# Patient Record
Sex: Female | Born: 1975 | Race: Black or African American | Hispanic: No | Smoking: Never smoker
Health system: Southern US, Community
[De-identification: ages and names within clinical notes are randomized; demographics above are authoritative.]

## PROBLEM LIST (undated history)

## (undated) DIAGNOSIS — D219 Benign neoplasm of connective and other soft tissue, unspecified: Secondary | ICD-10-CM

## (undated) DIAGNOSIS — IMO0002 Reserved for concepts with insufficient information to code with codable children: Secondary | ICD-10-CM

## (undated) DIAGNOSIS — R87619 Unspecified abnormal cytological findings in specimens from cervix uteri: Secondary | ICD-10-CM

## (undated) DIAGNOSIS — B009 Herpesviral infection, unspecified: Secondary | ICD-10-CM

## (undated) DIAGNOSIS — G43009 Migraine without aura, not intractable, without status migrainosus: Secondary | ICD-10-CM

## (undated) HISTORY — DX: Benign neoplasm of connective and other soft tissue, unspecified: D21.9

## (undated) HISTORY — PX: COLPOSCOPY: SHX161

## (undated) HISTORY — DX: Reserved for concepts with insufficient information to code with codable children: IMO0002

## (undated) HISTORY — DX: Unspecified abnormal cytological findings in specimens from cervix uteri: R87.619

## (undated) HISTORY — DX: Herpesviral infection, unspecified: B00.9

## (undated) HISTORY — DX: Migraine without aura, not intractable, without status migrainosus: G43.009

---

## 2004-11-14 ENCOUNTER — Other Ambulatory Visit: Admission: RE | Admit: 2004-11-14 | Discharge: 2004-11-14 | Payer: Self-pay | Admitting: Obstetrics and Gynecology

## 2004-12-02 HISTORY — PX: TUBAL LIGATION: SHX77

## 2005-07-18 ENCOUNTER — Inpatient Hospital Stay (HOSPITAL_COMMUNITY): Admission: AD | Admit: 2005-07-18 | Discharge: 2005-07-18 | Payer: Self-pay | Admitting: Obstetrics and Gynecology

## 2005-07-20 ENCOUNTER — Inpatient Hospital Stay (HOSPITAL_COMMUNITY): Admission: AD | Admit: 2005-07-20 | Discharge: 2005-07-22 | Payer: Self-pay | Admitting: Obstetrics and Gynecology

## 2006-07-31 ENCOUNTER — Other Ambulatory Visit: Admission: RE | Admit: 2006-07-31 | Discharge: 2006-07-31 | Payer: Self-pay | Admitting: Obstetrics and Gynecology

## 2007-06-02 ENCOUNTER — Other Ambulatory Visit: Admission: RE | Admit: 2007-06-02 | Discharge: 2007-06-02 | Payer: Self-pay | Admitting: Obstetrics and Gynecology

## 2007-06-24 ENCOUNTER — Ambulatory Visit (HOSPITAL_COMMUNITY): Admission: RE | Admit: 2007-06-24 | Discharge: 2007-06-24 | Payer: Self-pay | Admitting: Obstetrics and Gynecology

## 2007-12-03 HISTORY — PX: BREAST SURGERY: SHX581

## 2007-12-14 ENCOUNTER — Other Ambulatory Visit: Admission: RE | Admit: 2007-12-14 | Discharge: 2007-12-14 | Payer: Self-pay | Admitting: Obstetrics and Gynecology

## 2008-01-07 ENCOUNTER — Encounter: Admission: RE | Admit: 2008-01-07 | Discharge: 2008-02-04 | Payer: Self-pay | Admitting: Plastic Surgery

## 2008-03-24 ENCOUNTER — Ambulatory Visit (HOSPITAL_BASED_OUTPATIENT_CLINIC_OR_DEPARTMENT_OTHER): Admission: RE | Admit: 2008-03-24 | Discharge: 2008-03-25 | Payer: Self-pay | Admitting: Plastic Surgery

## 2008-03-24 ENCOUNTER — Encounter (INDEPENDENT_AMBULATORY_CARE_PROVIDER_SITE_OTHER): Payer: Self-pay | Admitting: Plastic Surgery

## 2011-04-16 NOTE — Op Note (Signed)
NAME:  Chelsea Short, FANTON  ACCOUNT NO.:  [id number]   MEDICAL RECORD NO.:  [id number]          PATIENT TYPE:  AMB   LOCATION:  SDC                           FACILITY:  [facility]   PHYSICIAN:  Charles A. Delcambre, MDDATE OF BIRTH:  08-12-1976   DATE OF PROCEDURE:  06/24/2007  DATE OF DISCHARGE:                               OPERATIVE REPORT   PREOPERATIVE DIAGNOSIS:  Undesired fertility.   POSTOPERATIVE DIAGNOSES:  1. Undesired fertility.  2. Mild endometriosis.   PROCEDURE:  Laparoscopic bilateral tubal ligation with Falope ring.   SURGEON:  Charles A. Delcambre, MD.   ASSISTANT:  None.   COMPLICATIONS:  None.   ESTIMATED BLOOD LOSS:  Less than 5 ml.   SPECIMENS:  None.   ANESTHESIA:  General by the endotracheal route.   FINDINGS:  Normal upper abdomen grossly, could not see the appendix, the  left uterosacral and ovarian fossa Masters-Johnson window consistent  with mild endometriosis.  No active disease noted other than this.   DESCRIPTION OF PROCEDURE:  Patient was taken to the operating room and  placed in supine position.  A general anesthetic was induced without  difficulty.  She was then placed in dorsal lithotomy position in  universal stirrups, and sterile prep and drape was undertaken.  A  tenaculum was used on the cervix, a Cohen cannula was placed.  Attention  was given to the abdomen, 0.25% plain Marcaine was injected, a total of  10 ml divided approximately 7 ml at the umbilicus and 3 ml at the  suprapubic port site.  A 1-cm incision was made at the umbilicus, a  Veress needle was placed with anterior traction on the abdominal wall.  Aspiration, hanging injection, reaspiration, hanging drop test, and  insufflation pressure was 4 mmHg.  All indicated anteperitoneal  location.  Flow at that time was 2 liters per minute and this was  increased to high flow once the intraperitoneal location was verified  and she had adequate pneumoperitoneum at 2.5 liters  CO2 insufflated.  The Veress needle was removed and true traction on the abdominal wall  was again placed, a 10-mm port was placed without damage to bowel,  bladder, or vascular structures.  The anteperitoneal location was  verified by immediate placement of the scope.  Under direct  visualization, the Falope ring applicator was made through approximately  an 8-mm incision two fingerbreadths below the symphysis pubis at the  midline.  There was no damage to bowel, bladder, or vascular structures.  The Falope ring applicator was then used to place Falope rings easily  seeing the mid isthmic portion of the tube approximately 2.5 cm from the  cornua.  An adequate knuckle of tube was pulled up on either side with  one Falope ring applied on each side.  Low pressure visualization of the  trocar sites was visualized and was hemostatic.  Desufflation was  allowed to occur, trocars were removed, the peritoneal edge was  visualized upon removal of the umbilical port.  A #0 Vicryl was used to  plicate the fascia at the umbilicus carefully, then a #3-0 Monocryl was  used to close the umbilical incision with a  subcuticular stitch.  Hemostasis was good at the lower incision site.  Pressure was held to  ensure hemostasis was good, and then Dermabond was placed on the  incision to seal it.  The tenaculum was removed from the cervix, silver  nitrate was placed, minor bleeding was noted, pressure was held, silver  nitrate was used again, and this achieved excellent hemostasis.  All  instruments were removed, the patient was awakened and taken to recovery  with the physician in attendance.      Charles A. Sydnee Cabal, MD  Electronically Signed     CAD/MEDQ  D:  06/24/2007  T:  06/24/2007  Job:  [id number]

## 2011-04-16 NOTE — H&P (Signed)
NAME:  Chelsea Short, ESCHETE  ACCOUNT NO.:  [id number]   MEDICAL RECORD NO.:  [id number]          PATIENT TYPE:  AMB   LOCATION:  SDC                           [facility]ACILITY:  [facility]   PHYSICIAN:  Charles A. Delcambre, MDDATE OF BIRTH:  [birth year]   DATE OF ADMISSION:  06/18/2007  DATE OF DISCHARGE:                              HISTORY & PHYSICAL   DATE OF ADMISSION:  This patient will be admitted on June 18, 2007 to  undergo laparoscopic tubal ligation.   INFORMED CONSENT:  She gives informed consent, accepts risks of injury  to bowel or bladder, vascular structures, ureteral damage, blood product  risks including hepatitis and HIV exposure, approximately 1 in 400  failure rate.  Alternative forms of contraception including vasectomy  have been discussed.  She gives informed consent in this regard.   HISTORY OF PRESENT ILLNESS:  The patient is a 35 year old gravida 3,  para 1-0-2-0, last menstrual period was May 14, 2007.  All questions  were answered.  She gives informed consent.   PAST MEDICAL HISTORY:  None.   PAST SURGICAL HISTORY:  None other than vaginal delivery and D and C's  for miscarriages.   MEDICATIONS:  Currently none.   ALLERGIES:  No known drug allergies.   FAMILY HISTORY:  No major illnesses.   REVIEW OF SYSTEMS:  No chest pain, shortness of breath, wheezing,  headache, vision changes, abdominal changes, bowel or bladder changes.   PHYSICAL EXAMINATION:  GENERAL APPEARANCE:  Alert and oriented x3.  VITAL SIGNS:  Blood pressure 124/80.  Respirations 16.  Heart rate 80.  Weight 180 pounds.  HEENT:  Exam is grossly within normal limits.  NECK:  Supple without thyromegaly or adenopathy.  LUNGS:  Clear bilaterally.  BACK:  No costovertebral angle tenderness, vertebral column is nontender  to palpation.  HEART:  Regular rate and rhythm without murmurs, rubs or gallops.  BREASTS:  No masses, tenderness, discharge, skin or nipple changes  bilaterally.  ABDOMEN:   Soft, flat, nontender.  No hepatosplenomegaly noted.  PELVIC:  Normal external female genitalia.  Bartholin's, urethral and  Skene's within normal limits.  Vault is without discharge or lesions.  Multiparous cervix.  Uterus anteverted, six weeks size.  Adnexa  nontender without masses bilaterally.  Ovaries palpable and normal size  bilaterally.  RECTAL:  Exam is not done.  Anus and perineal body appear normal.   ASSESSMENT:  Request for sterilization by bilateral tubal ligation.  Informed consent is given as noted above.   PLAN:  Surgery is scheduled for June 18, 2007.  Plan preoperative CBC,  quantitative HCG and we will proceed as outlined.  She will remain NPO  past 0600 with surgery scheduled in the afternoon.      Charles A. Sydnee Cabal, MD  Electronically Signed     CAD/MEDQ  D:  06/04/2007  T:  06/05/2007  Job:  [id number]

## 2011-04-16 NOTE — Op Note (Signed)
NAME:  Chelsea Short, Chelsea Short              ACCOUNT NO.:  [id number]   MEDICAL RECORD NO.:  [id number]          PATIENT TYPE:  AMB   LOCATION:  DSC                          FACILITY:  [facility]   PHYSICIAN:  Brantley Persons, M.D.DATE OF BIRTH:  [birth year]   DATE OF PROCEDURE:  03/24/2008  DATE OF DISCHARGE:                               OPERATIVE REPORT   POSTOPERATIVE DIAGNOSIS:  Bilateral macromastia.   POSTOPERATIVE DIAGNOSIS:  Bilateral macromastia.   PROCEDURE:  Bilateral reduction mammoplasties.   ATTENDING SURGEON:  Brantley Persons, MD   ANESTHESIA:  General endotracheal.   ANESTHESIOLOGIST:  Germaine Pomfret, MD   ESTIMATED BLOOD LOSS:  200 mL.   FLUID REPLACEMENT:  3200 mL crystalloid.   URINE OUTPUT:  200 mL.   COMPLICATIONS:  None.   JUSTIFICATION FOR OVERNIGHT STAY:  Progressive pain control along with  ambulation and monitoring of the nipples and breast flaps.   INDICATIONS FOR PROCEDURE:  The patient is a 35 year old Hispanic female  who has bilateral macromastia that is clinically symptomatic.  She  presents to undergo bilateral reduction mammoplasties.   PROCEDURE:  The patient was marked in the preop holding area in the  pattern of WISE for the future bilateral reduction mammoplasties.  She  was then taken back to the OR and placed on the table in supine  position.  After adequate general endotracheal anesthesia was obtained,  the patient's chest was prepped with [employer]icare and draped in the  sterile fashion.  The base of the breast had been injected with 1%  lidocaine with epinephrine.  After adequate hemostasis anesthesia taken  effect, the procedure was begun.   Both of the breast reductions were performed in the following similar  manner.  The nipple-areolar complexes were marked with the 45 mm nipple  marker.  This was then incised and the skin was de-epithelialized down  to the inframammary crease in the inferior pedicle pattern.  Next, the  medial,  superior, and lateral skin flaps were elevated down to the chest  wall.  The excess fat and glandular tissue were removed from the  inferior pedicle.  The nipple-areolar complex was examined and found be  pink and viable.  The wound was then irrigated with saline irrigation.  Meticulous hemostasis was obtained with Bovie electrocautery.  The  inferior pedicle was then centralized using 3-0 Prolene suture.  A #10  JP flat fully-fluted drain was then placed into the wound.  The skin  flaps were brought together at the inverted T-junction with a 2-0  Prolene suture.  The incisions were stapled for temporary closure.  The  breasts were compared and found to have good shape and symmetry.  The  incisions were then closed from the medial aspect of the JP drain to the  medial aspect of the WISE pattern, first placing a few sutures in the  dermal layer with 3-0 Monocryl suture and then both the dermal and  cuticular layer were closed using a Quill 2-0 PDO barbed suture.  Lateral to the JP drain, incision was closed using 3-0 Monocryl in the  dermal layer followed by 3-0  Monocryl running intracuticular stitch on  the skin.  The vertical limb of the WISE pattern was closed in the  dermal layer using 3-0 Monocryl suture.   The patient was then placed in the upright position.  The future  location of the nipple-areolar complexes was marked on both breast  mounds using the 42 mm nipple marker.  She was then placed back in the  recumbent position.   Both of the nipple-areolar complexes were then brought out onto the  breast mound in the following similar manner.  The skin was incised as  marked.  The tissue was excised in full thickness.  The nipple-areolar  complex was examined and found to be pink and viable, then brought out  through this aperture and sewn in place using 4-0 Monocryl in the dermal  layer followed by 5-0 Monocryl running intracuticular stitch on the  skin.  This 5-0 Monocryl suture  was then brought down to close the  cuticular layer of the vertical limb as well.  The JP drain was sewn in  place using 3-0 nylon suture.  The incisions were dressed with Benzoin  and Steri-Strips and the nipples additionally with bacitracin ointment  and Adaptic.  There were no complications.  The patient tolerated the  procedure well.  The base of the breasts and the skin and soft tissues  were then injected with 0.5% Marcaine with epinephrine to provide a  postsurgical anesthetic block.  4 x 4s were placed over the incisions  and ABD pads over the axillary areas.  The patient was then placed into  a light postoperative support bra.   There were no complications.  The patient tolerated the procedure well.  The final needle and sponge counts were reported to be correct at the  end of the case.  The patient was then awakened from the general  anesthesia and taken to the recovery room in stable condition.  She was  recovered without complications and then taken to the [facility] where she will  stay overnight.  She will stay overnight for monitoring of the nipples  and breast flaps as well as progressive pain control and ambulation.[name]  Discharge is planned for the morning.           ______________________________  Brantley Persons, M.D.     MC/MEDQ  D:  03/24/2008  T:  03/25/2008  Job:  [id number]

## 2011-09-16 LAB — CBC
HCT: 39.9
MCHC: 33.7
MCV: 90.5
RBC: 4.41
WBC: 5.9

## 2011-09-16 LAB — HCG, SERUM, QUALITATIVE: Preg, Serum: NEGATIVE

## 2013-05-10 ENCOUNTER — Encounter: Payer: Self-pay | Admitting: *Deleted

## 2013-05-11 ENCOUNTER — Ambulatory Visit (INDEPENDENT_AMBULATORY_CARE_PROVIDER_SITE_OTHER): Payer: BC Managed Care – PPO | Admitting: Nurse Practitioner

## 2013-05-11 ENCOUNTER — Encounter: Payer: Self-pay | Admitting: Nurse Practitioner

## 2013-05-11 VITALS — BP 126/74 | HR 68 | Resp 14 | Ht 65.5 in | Wt 213.0 lb

## 2013-05-11 DIAGNOSIS — B9689 Other specified bacterial agents as the cause of diseases classified elsewhere: Secondary | ICD-10-CM

## 2013-05-11 DIAGNOSIS — N760 Acute vaginitis: Secondary | ICD-10-CM

## 2013-05-11 DIAGNOSIS — A499 Bacterial infection, unspecified: Secondary | ICD-10-CM

## 2013-05-11 MED ORDER — METRONIDAZOLE 500 MG PO TABS: 500.0000 mg | ORAL_TABLET | ORAL | Status: DC

## 2013-05-11 NOTE — Progress Notes (Signed)
Subjective:     Patient ID: Chelsea Short, female   DOB: Sep 29, 1976, 37 y.o.   MRN: [id number]  Vaginal Discharge The patient's primary symptoms include a vaginal discharge. Primary symptoms comment: Vaginal discharge AND odor for past 2-3 weeks. This is a recurrent problem. The current episode started 1 to 4 weeks ago. The problem occurs constantly. The problem has been unchanged. The patient is experiencing no pain. Associated symptoms include painful intercourse. Pertinent negatives include no abdominal pain, anorexia, back pain, chills, constipation, diarrhea, fever, frequency, hematuria, nausea, urgency or vomiting. The vaginal discharge was white, malodorous and milky. She has not been passing clots. Treatments tried: Baking soda and h2o. She is sexually active. No, her partner does not have an STD. She uses tubal ligation (same partner for 14 months) for contraception. Her menstrual history has been regular. Her past medical history is significant for vaginosis.     Review of Systems  Constitutional: Negative for fever and chills.  Gastrointestinal: Negative for nausea, vomiting, abdominal pain, diarrhea, constipation and anorexia.  Genitourinary: Positive for vaginal discharge. Negative for urgency, frequency and hematuria.       LMP 04/28/13 and normal. Has no Urinary symptoms.  Musculoskeletal: Negative for back pain.  Neurological: Negative.   Hematological: Negative.   Psychiatric/Behavioral: Negative.        Objective:   Physical Exam  Constitutional: She is oriented to person, place, and time. She appears well-developed and well-nourished.  Pulmonary/Chest: Effort normal.  Abdominal: Soft. She exhibits no distension and no mass. There is no tenderness. There is no rebound and no guarding.  Genitourinary:  Thin white to grey vagina discharge.  Ph 5.5, Saline is + for clue cells, no trich,  KOH negative for yeast. No pain on bimanual exam.  Musculoskeletal: Normal range of  motion.  Neurological: She is alert and oriented to person, place, and time.  Psychiatric: She has a normal mood and affect. Her behavior is normal. Thought content normal.       Assessment:    BV with history of same No concerns about STD's    Plan:     Patient's preference is po Flagyl at 1 Gm X 2 doses instead of 7 day therapy.  States she does not get GI side effects and does not consume ETOH. If symptoms are not resolved or worsens to call back.

## 2013-05-11 NOTE — Patient Instructions (Signed)
Bacterial Vaginosis Bacterial vaginosis (BV) is a vaginal infection where the normal balance of bacteria in the vagina is disrupted. The normal balance is then replaced by an overgrowth of certain bacteria. There are several different kinds of bacteria that can cause BV. BV is the most common vaginal infection in women of childbearing age. CAUSES   The cause of BV is not fully understood. BV develops when there is an increase or imbalance of harmful bacteria.  Some activities or behaviors can upset the normal balance of bacteria in the vagina and put women at increased risk including:  Having a new sex partner or multiple sex partners.  Douching.  Using an intrauterine device (IUD) for contraception.  It is not clear what role sexual activity plays in the development of BV. However, women that have never had sexual intercourse are rarely infected with BV. Women do not get BV from toilet seats, bedding, swimming pools or from touching objects around them.  SYMPTOMS   Grey vaginal discharge.  A fish-like odor with discharge, especially after sexual intercourse.  Itching or burning of the vagina and vulva.  Burning or pain with urination.  Some women have no signs or symptoms at all. DIAGNOSIS  Your caregiver must examine the vagina for signs of BV. Your caregiver will perform lab tests and look at the sample of vaginal fluid through a microscope. They will look for bacteria and abnormal cells (clue cells), a pH test higher than 4.5, and a positive amine test all associated with BV.  RISKS AND COMPLICATIONS   Pelvic inflammatory disease (PID).  Infections following gynecology surgery.  Developing HIV.  Developing herpes virus. TREATMENT  Sometimes BV will clear up without treatment. However, all women with symptoms of BV should be treated to avoid complications, especially if gynecology surgery is planned. Female partners generally do not need to be treated. However, BV may spread  between female sex partners so treatment is helpful in preventing a recurrence of BV.   BV may be treated with antibiotics. The antibiotics come in either pill or vaginal cream forms. Either can be used with nonpregnant or pregnant women, but the recommended dosages differ. These antibiotics are not harmful to the baby.  BV can recur after treatment. If this happens, a second round of antibiotics will often be prescribed.  Treatment is important for pregnant women. If not treated, BV can cause a premature delivery, especially for a pregnant woman who had a premature birth in the past. All pregnant women who have symptoms of BV should be checked and treated.  For chronic reoccurrence of BV, treatment with a type of prescribed gel vaginally twice a week is helpful. HOME CARE INSTRUCTIONS   Finish all medication as directed by your caregiver.  Do not have sex until treatment is completed.  Tell your sexual partner that you have a vaginal infection. They should see their caregiver and be treated if they have problems, such as a mild rash or itching.  Practice safe sex. Use condoms. Only have 1 sex partner. PREVENTION  Basic prevention steps can help reduce the risk of upsetting the natural balance of bacteria in the vagina and developing BV:  Do not have sexual intercourse (be abstinent).  Do not douche.  Use all of the medicine prescribed for treatment of BV, even if the signs and symptoms go away.  Tell your sex partner if you have BV. That way, they can be treated, if needed, to prevent reoccurrence. SEEK MEDICAL CARE IF:     Your symptoms are not improving after 3 days of treatment.  You have increased discharge, pain, or fever. MAKE SURE YOU:   Understand these instructions.  Will watch your condition.  Will get help right away if you are not doing well or get worse. FOR MORE INFORMATION  Division of STD Prevention (DSTDP), Centers for Disease Control and Prevention:  www.cdc.gov/std American [employer] Association (ASHA): www.ashastd.org  Document Released: 11/18/2005 Document Revised: 02/10/2012 Document Reviewed: 05/11/2009 [employer] Patient Information 2014 [employer].  

## 2013-05-13 NOTE — Progress Notes (Signed)
Reviewed personally.  M. Suzanne Angelina Venard, MD.  

## 2013-08-30 ENCOUNTER — Emergency Department (HOSPITAL_COMMUNITY)
Admission: EM | Admit: 2013-08-30 | Discharge: 2013-08-30 | Disposition: A | Discharge status: Home / Self Care | Payer: BC Managed Care – PPO | Attending: Emergency Medicine | Admitting: Emergency Medicine

## 2013-08-30 ENCOUNTER — Emergency Department (HOSPITAL_COMMUNITY): Payer: BC Managed Care – PPO

## 2013-08-30 ENCOUNTER — Encounter (HOSPITAL_COMMUNITY): Payer: Self-pay | Admitting: *Deleted

## 2013-08-30 DIAGNOSIS — Z79899 Other long term (current) drug therapy: Secondary | ICD-10-CM | POA: Insufficient documentation

## 2013-08-30 DIAGNOSIS — H9319 Tinnitus, unspecified ear: Secondary | ICD-10-CM | POA: Insufficient documentation

## 2013-08-30 DIAGNOSIS — Z8619 Personal history of other infectious and parasitic diseases: Secondary | ICD-10-CM | POA: Insufficient documentation

## 2013-08-30 DIAGNOSIS — Z792 Long term (current) use of antibiotics: Secondary | ICD-10-CM | POA: Insufficient documentation

## 2013-08-30 DIAGNOSIS — M542 Cervicalgia: Secondary | ICD-10-CM | POA: Insufficient documentation

## 2013-08-30 DIAGNOSIS — R1310 Dysphagia, unspecified: Secondary | ICD-10-CM | POA: Insufficient documentation

## 2013-08-30 DIAGNOSIS — I889 Nonspecific lymphadenitis, unspecified: Secondary | ICD-10-CM | POA: Insufficient documentation

## 2013-08-30 DIAGNOSIS — R51 Headache: Secondary | ICD-10-CM | POA: Insufficient documentation

## 2013-08-30 DIAGNOSIS — R519 Headache, unspecified: Secondary | ICD-10-CM

## 2013-08-30 DIAGNOSIS — H53149 Visual discomfort, unspecified: Secondary | ICD-10-CM | POA: Insufficient documentation

## 2013-08-30 LAB — CBC WITH DIFFERENTIAL/PLATELET
Basophils Relative: 0 % (ref 0–1)
Eosinophils Absolute: 0 10*3/uL (ref 0.0–0.7)
Eosinophils Relative: 0 % (ref 0–5)
HCT: 37.3 % (ref 36.0–46.0)
Lymphocytes Relative: 23 % (ref 12–46)
Lymphs Abs: 2.3 10*3/uL (ref 0.7–4.0)
MCH: 29.9 pg (ref 26.0–34.0)
MCHC: 34.3 g/dL (ref 30.0–36.0)
MCV: 87.1 fL (ref 78.0–100.0)
Monocytes Absolute: 0.8 10*3/uL (ref 0.1–1.0)
Platelets: 218 10*3/uL (ref 150–400)
RBC: 4.28 MIL/uL (ref 3.87–5.11)
RDW: 14.2 % (ref 11.5–15.5)

## 2013-08-30 LAB — BASIC METABOLIC PANEL
CO2: 25 mEq/L (ref 19–32)
Calcium: 9 mg/dL (ref 8.4–10.5)
Creatinine, Ser: 0.91 mg/dL (ref 0.50–1.10)
GFR calc non Af Amer: 80 mL/min — ABNORMAL LOW (ref >90)
Glucose, Bld: 87 mg/dL (ref 70–99)
Sodium: 132 mEq/L — ABNORMAL LOW (ref 135–145)

## 2013-08-30 MED ORDER — SODIUM CHLORIDE 0.9 % IV BOLUS (SEPSIS)
1000.0000 mL | Freq: Once | INTRAVENOUS | Status: AC
  Administered 2013-08-30: 1000 mL via INTRAVENOUS

## 2013-08-30 MED ORDER — KETOROLAC TROMETHAMINE 30 MG/ML IJ SOLN
30.0000 mg | Freq: Once | INTRAMUSCULAR | Status: AC
  Administered 2013-08-30: 30 mg via INTRAVENOUS
  Filled 2013-08-30: qty 1

## 2013-08-30 MED ORDER — METOCLOPRAMIDE HCL 5 MG/ML IJ SOLN
10.0000 mg | Freq: Once | INTRAMUSCULAR | Status: AC
  Administered 2013-08-30: 10 mg via INTRAVENOUS
  Filled 2013-08-30: qty 2

## 2013-08-30 MED ORDER — HYDROCODONE-ACETAMINOPHEN 5-325 MG PO TABS: 1.0000 | ORAL_TABLET | Freq: Four times a day (QID) | ORAL | Status: DC | PRN

## 2013-08-30 MED ORDER — CLINDAMYCIN HCL 150 MG PO CAPS: 300.0000 mg | ORAL_CAPSULE | Freq: Three times a day (TID) | ORAL | Status: DC

## 2013-08-30 MED ORDER — IOHEXOL 300 MG/ML  SOLN
75.0000 mL | Freq: Once | INTRAMUSCULAR | Status: AC | PRN
  Administered 2013-08-30: 75 mL via INTRAVENOUS

## 2013-08-30 NOTE — ED Notes (Signed)
Patient states migraine since last Thursday, patient states pain in back of head down into neck and shoulders

## 2013-08-30 NOTE — ED Provider Notes (Signed)
CSN: [id number]     Arrival date & time 08/30/13  0804 History   First MD Initiated Contact with Patient 08/30/13 [phone]     Chief Complaint  Patient presents with  . Migraine   (Consider location/radiation/quality/duration/timing/severity/associated sxs/prior Treatment) HPI Comments: Patient is a 37 year old female with a history of migraine headaches who presents for an occipital headache without thunderclap onset x 5 days. Patient states that symptoms have been constant since onset and without alleviating factors. Patient has taken Excedrin Migraine and ibuprofen for her symptoms without relief. Patient states that pain radiates from her occipital region down her posterior neck and to her bilateral shoulders and chest. Patient states the symptoms were preceded by anterior neck pain which has also persisted. She endorses associated tender and enlarged lymph nodes bilaterally as well as pain with swallowing and intermittent drooling. Patient denies speech difficulty, but states that talking aggravates her neck pain. Patient further endorsing associated intermittent tinnitus, photophobia, and phonophobia. She denies any recent tick bites or insect bites as well as any recent travel or travel outside the country. She further denies associated fever, sore throat, ear discharge, inability to move her neck, chest pain, nausea or vomiting, extremity weakness, numbness or tingling, and an inability to ambulate.  Patient is a 37 y.o. female presenting with migraines. The history is provided by the patient. No language interpreter was used.  Migraine Associated symptoms include headaches and neck pain (anterior). Pertinent negatives include no chest pain, congestion, fever, nausea, numbness, sore throat, vomiting or weakness.    Past Medical History  Diagnosis Date  . Migraine without aura   . Abnormal Pap smear 04/25/2011    ASCUS  . HSV-1 infection    Past Surgical History  Procedure Laterality Date   . Colposcopy  2012    ASCUS  . Tubal ligation      BTL  . Breast surgery Bilateral     bilateral breast reduction  . Vaginal delivery     Family History  Problem Relation Age of Onset  . Cancer Paternal Grandfather     pancreatic cancer   History  Substance Use Topics  . Smoking status: Never Smoker   . Smokeless tobacco: Not on file  . Alcohol Use: Yes   OB History   Grav Para Term Preterm Abortions TAB SAB Ect Mult Living   4 1   2  2   1      Review of Systems  Constitutional: Negative for fever.  HENT: Positive for trouble swallowing (Secondary to pain), neck pain (anterior) and tinnitus. Negative for congestion, sore throat, rhinorrhea, neck stiffness and ear discharge.   Eyes: Positive for photophobia.  Cardiovascular: Negative for chest pain.  Gastrointestinal: Negative for nausea and vomiting.  Neurological: Positive for headaches. Negative for syncope, weakness and numbness.  All other systems reviewed and are negative.    Allergies  Diflucan  Home Medications   Current Outpatient Rx  Name  Route  Sig  Dispense  Refill  . aspirin-acetaminophen-caffeine (EXCEDRIN MIGRAINE) 250-250-65 MG per tablet   Oral   Take 2 tablets by mouth every 6 (six) hours as needed for pain.         [name] ibuprofen (ADVIL,MOTRIN) 200 MG tablet   Oral   Take 1,200 mg by mouth 2 (two) times daily as needed for pain.         . Multiple Vitamin (MULTIVITAMIN) tablet   Oral   Take 1 tablet by mouth daily.         [name]  SUMAtriptan (IMITREX) 100 MG tablet   Oral   Take 100 mg by mouth every 2 (two) hours as needed for migraine. May repeat in 2 hours if headache persists or recurs.         . clindamycin (CLEOCIN) 150 MG capsule   Oral   Take 2 capsules (300 mg total) by mouth 3 (three) times daily. May dispense as 150mg  capsules   60 capsule   0   . HYDROcodone-acetaminophen (NORCO/VICODIN) 5-325 MG per tablet   Oral   Take 1 tablet by mouth every 6 (six) hours as needed  for pain.   13 tablet   0    BP 108/62  Pulse 80  Temp(Src) 98.6 F (37 C) (Oral)  Resp 18  Ht 5\' 2"  (1.575 m)  Wt 185 lb (83.915 kg)  BMI 33.83 kg/m2  SpO2 99%  LMP 08/16/2013  Physical Exam  Nursing note and vitals reviewed. Constitutional: She is oriented to person, place, and time. She appears well-developed and well-nourished. No distress.  Patient uncomfortable appearing; nontoxic and in no acute distress.  HENT:  Head: Normocephalic and atraumatic.  Mouth/Throat: Oropharynx is clear and moist. No oropharyngeal exudate.  Uvula midline and airway patent. Patient tolerating secretions without difficulty. There is no trismus. No oral lesions or bleeding. Bilateral external ears, canals, and tympanic membranes unremarkable. No mastoid tenderness, erythema, swelling, or heat to touch bilaterally. Dentition normal.  Eyes: Conjunctivae and EOM are normal. Pupils are equal, round, and reactive to light. No scleral icterus.  Neck: Normal range of motion. Neck supple.  No nuchal rigidity. Negative Brudzinski's sign.  Cardiovascular: Normal rate, regular rhythm and normal heart sounds.   Pulmonary/Chest: Effort normal and breath sounds normal. No respiratory distress. She has no wheezes. She has no rales.  Abdominal: Soft. She exhibits no distension. There is no tenderness.  Musculoskeletal: Normal range of motion.  Lymphadenopathy:    She has cervical adenopathy (Marked tenderness and swelling of bilateral lymph nodes at mandibular angle).  Neurological: She is alert and oriented to person, place, and time.  Patient speaks in full goal oriented sentences. Cranial nerves III through XII grossly intact. Patient has equal grip strength bilaterally with 5/5 strength against resistance in her upper and lower extremities. DTRs normal and symmetric and patient without sensory or motor deficits. She moves her extremities without ataxia.  Skin: Skin is warm and dry. No rash noted. She is not  diaphoretic. No erythema. No pallor.  Psychiatric: She has a normal mood and affect. Her behavior is normal.    ED Course  Procedures (including critical care time) Labs Review Labs Reviewed  BASIC METABOLIC PANEL - Abnormal; Notable for the following:    Sodium 132 (*)    GFR calc non Af Amer 80 (*)    All other components within normal limits  RAPID STREP SCREEN  CULTURE, GROUP A STREP  CBC WITH DIFFERENTIAL   Imaging Review Ct Head Wo Contrast  08/30/2013   CLINICAL DATA:  Migraine  EXAM: CT HEAD WITHOUT CONTRAST  TECHNIQUE: Contiguous axial images were obtained from the base of the skull through the vertex without intravenous contrast.  COMPARISON:  None.  FINDINGS: No skull fracture is noted. Paranasal sinuses and mastoid air cells are unremarkable.  No intracranial hemorrhage, mass effect or midline shift.  No hydrocephalus. The gray and white-matter differentiation is preserved.  No acute infarction. No mass lesion is noted on this unenhanced scan. No intra or extra-axial fluid collection.  IMPRESSION: No  acute intracranial abnormality.   Electronically Signed   By: Natasha Mead   On: 08/30/2013 10:06   Ct Soft Tissue Neck W Contrast  08/30/2013   CLINICAL DATA:  Sore throat. Dysphagia. Swollen lymph nodes.  EXAM: CT NECK WITH CONTRAST  TECHNIQUE: Multidetector CT imaging of the neck was performed using the standard protocol following the bolus administration of intravenous contrast.  CONTRAST:  75mL OMNIPAQUE IOHEXOL 300 MG/ML  SOLN  COMPARISON:  None.  FINDINGS: Lung apices are clear. No superior mediastinal pathology.  The parotid glands are normal. The submandibular glands are normal. The thyroid gland is normal.  There are symmetrically enlarged level 2 and level 3 lymph nodes on both sides of the neck. The largest node on each side is a level 2 node on the order of 2 cm in diameter and extending over a length of 3.5 cm. The nodes do not show necrosis or central low density. These are  nonspecific and could be reactive nodes to a systemic inflammatory process. Lymphoma is not excluded.  There is no evidence of mucosal or submucosal lesion. No evidence of tonsillar abscess. No significant osseous or articular finding. The vascular structures appear unremarkable.  IMPRESSION: Enlarged lymph nodes level 2 and level 3 bilaterally. This is nonspecific and could be related to reactive lymphadenopathy to a systemic inflammatory process. Lymphoma, though less likely, is not excluded based on this imaging pattern.   Electronically Signed   By: Paulina Fusi M.D.   On: 08/30/2013 10:44    MDM   1. Headache   2. Cervical lymphadenitis    Patient is a 37 y/o female with a hx of migraines who presents for a headache with anterior neck pain. Physical exam findings as above significant only for marked tenderness and swelling of bilateral lymph nodes at mandibular angle. No trismus or stridor. Negative Brudzinski's signs and no focal neurologic deficits. Further work up with CBC, BMP, Rapid Strep, and CT head and soft tissues neck to be pursued. Patient afebrile and hemodynamically stable. Denies tick bites and recent travel.  ED work up unremarkable; no acute changes on CT head and CT neck significant only for nonspecific, enlarged lymph nodes. Rapid strep negative. There is no leukocytosis. Patient endorses significant improvement in her pain with IV Toradol. States pain is down to a 5/10 from 9/10. Patient has continued to be well and nontoxic appearing and hemodynamically stable. She is afebrile. Patient stable and appropriate for discharge with primary care followup for further evaluation of symptoms. Patient given prescriptions of clindamycin for lymphadenitis and Norco to take as needed for pain control. Return precautions discussed and patient agreeable to plan with no unaddressed concerns  Antony Madura, PA-C 08/31/13 1712

## 2013-09-01 LAB — CULTURE, GROUP A STREP

## 2013-09-01 NOTE — ED Provider Notes (Signed)
Medical screening examination/treatment/procedure(s) were performed by non-physician practitioner and as supervising physician I was immediately available for consultation/collaboration.  Dessa Ledee, MD 09/01/13 1137 

## 2014-04-06 ENCOUNTER — Encounter: Payer: Self-pay | Admitting: Nurse Practitioner

## 2014-04-06 ENCOUNTER — Ambulatory Visit (INDEPENDENT_AMBULATORY_CARE_PROVIDER_SITE_OTHER): Payer: BC Managed Care – PPO | Admitting: Nurse Practitioner

## 2014-04-06 VITALS — BP 120/80 | HR 68 | Resp 16 | Ht 65.25 in | Wt 216.0 lb

## 2014-04-06 DIAGNOSIS — Z0000 Encounter for general adult medical examination without abnormal findings: Secondary | ICD-10-CM

## 2014-04-06 DIAGNOSIS — D239 Other benign neoplasm of skin, unspecified: Secondary | ICD-10-CM

## 2014-04-06 DIAGNOSIS — Z113 Encounter for screening for infections with a predominantly sexual mode of transmission: Secondary | ICD-10-CM

## 2014-04-06 DIAGNOSIS — D229 Melanocytic nevi, unspecified: Secondary | ICD-10-CM

## 2014-04-06 DIAGNOSIS — Z01419 Encounter for gynecological examination (general) (routine) without abnormal findings: Secondary | ICD-10-CM

## 2014-04-06 DIAGNOSIS — Z23 Encounter for immunization: Secondary | ICD-10-CM

## 2014-04-06 LAB — POCT URINALYSIS DIPSTICK
Bilirubin, UA: NEGATIVE
Blood, UA: NEGATIVE
Glucose, UA: NEGATIVE
Ketones, UA: NEGATIVE
Leukocytes, UA: NEGATIVE
Nitrite, UA: NEGATIVE
PH UA: 5
PROTEIN UA: NEGATIVE
Urobilinogen, UA: NEGATIVE

## 2014-04-06 NOTE — Progress Notes (Signed)
38 y.o. [id number] Legally Separated Hispanic Fe here for annual exam. Menses last for 1 week.  Heavy X 3 days. Super pad and tampon with changing every 3 hours. Less cramps in past 3 months. Ended last relationship yesterday. He had been unfaithful and has fathered another child during their time together.  Patient's last menstrual period was 03/06/2014.          Sexually active: yes  The current method of family planning is tubal ligation.    Exercising: no  exercise Smoker:  no  Health Maintenance: Pap: 05-05-12 neg TD: unsure maybe around 2005 or so Labs: Poct urine-neg Self breast exam: done oc   reports that she has never smoked. She does not have any smokeless tobacco history on file. She reports that she does not drink alcohol or use illicit drugs.  Past Medical History  Diagnosis Date  . Migraine without aura   . Abnormal Pap smear  2006, 2012    2006 abnormal pap; 04/25/11 ASCUS, colpo bx neg  . HSV-1 infection     Past Surgical History  Procedure Laterality Date  . Vaginal delivery    . Colposcopy  2012    ASCUS, 2006  . Breast surgery Bilateral 2009    bilateral breast reduction  . Tubal ligation  2006    BTL    Current Outpatient Prescriptions  Medication Sig Dispense Refill  . aspirin-acetaminophen-caffeine (EXCEDRIN MIGRAINE) 250-250-65 MG per tablet Take 2 tablets by mouth every 6 (six) hours as needed for pain.      [name] ibuprofen (ADVIL,MOTRIN) 200 MG tablet Take 1,200 mg by mouth 2 (two) times daily as needed for pain.      . Multiple Vitamin (MULTIVITAMIN) tablet Take 1 tablet by mouth daily.       No current facility-administered medications for this visit.    Family History  Problem Relation Age of Onset  . Thyroid disease Mother   . Hypertension Father   . Thyroid disease Sister   . Hypertension Maternal Grandmother   . Diabetes Maternal Grandmother   . Cancer Maternal Grandfather     pancreatic  . Diabetes Paternal Grandmother     ROS:  Pertinent  items are noted in HPI.  Otherwise, a comprehensive ROS was negative.  Exam:   BP 120/80  Pulse 68  Resp 16  Ht 5' 5.25" (1.657 m)  Wt 216 lb (97.977 kg)  BMI 35.68 kg/m2  LMP 03/06/2014 Height: 5' 5.25" (165.7 cm)  Ht Readings from Last 3 Encounters:  04/06/14 5' 5.25" (1.657 m)  08/30/13 5\' 2"  (1.575 m)  05/11/13 5' 5.5" (1.664 m)    General appearance: alert, cooperative and appears stated age Head: Normocephalic, without obvious abnormality, atraumatic Neck: no adenopathy, supple, symmetrical, trachea midline and thyroid normal to inspection and palpation Lungs: clear to auscultation bilaterally Breasts: normal appearance, no masses or tenderness, bilateral breast reduction scars Heart: regular rate and rhythm Abdomen: soft, non-tender; no masses,  no organomegaly Extremities: extremities normal, atraumatic, no cyanosis or edema Skin: Skin color, texture, turgor normal. No rashes or lesions, there is a moe change on right anterior chest wall at bra line - looks darker and larger than before.  She has a mole right side of face at the ear she thinks is also larger. Lymph nodes: Cervical, supraclavicular, and axillary nodes normal. No abnormal inguinal nodes palpated Neurologic: Grossly normal   Pelvic: External genitalia:  no lesions  Urethra:  normal appearing urethra with no masses, tenderness or lesions              Bartholin's and Skene's: normal                 Vagina: normal appearing vagina with normal color and discharge, no lesions              Cervix: anteverted              Pap taken: yes Bimanual Exam:  Uterus:  normal size, contour, position, consistency, mobility, non-tender              Adnexa: no mass, fullness, tenderness               Rectovaginal: Confirms               Anus:  normal sphincter tone, no lesions  A:  Well Woman with normal exam  S/P BTL 2006  S/P bilateral breast reduction 2009  R/O STD's  Relationship discord  Immunization  update  Changes in Nevi  P:   Reviewed health and wellness pertinent to exam  Pap smear taken today  Mammogram due age 49  Will follow with labs and pap  Immunization update with  TDaP today  Will get dermatology eval with Dr. Tonia Brooms, et al  Counseled on breast self exam, mammography screening, STD prevention, adequate intake of calcium and vitamin D, diet and exercise return annually or prn  An After Visit Summary was printed and given to the patient.

## 2014-04-06 NOTE — Patient Instructions (Signed)

## 2014-04-07 LAB — STD PANEL
HIV 1&2 Ab, 4th Generation: NONREACTIVE
Hepatitis B Surface Ag: NEGATIVE

## 2014-04-08 LAB — IPS N GONORRHOEA AND CHLAMYDIA BY PCR

## 2014-04-10 NOTE — Progress Notes (Signed)
Encounter reviewed by Dr. Tayten Heber Silva.  

## 2014-04-11 ENCOUNTER — Telehealth: Payer: Self-pay | Admitting: Nurse Practitioner

## 2014-04-11 NOTE — Telephone Encounter (Signed)
Patient declined appointment scheduled with Dr. Tonia Brooms. States that she will call and scheduled on her own.

## 2014-04-12 ENCOUNTER — Telehealth: Payer: Self-pay | Admitting: *Deleted

## 2014-04-12 NOTE — Telephone Encounter (Signed)
Pt returning call

## 2014-04-12 NOTE — Telephone Encounter (Signed)
Pt notified in result note.  

## 2014-04-12 NOTE — Telephone Encounter (Signed)
Message copied by Graylon Good on Tue Apr 12, 2014 10:23 AM ------      Message from: Kem Boroughs R      Created: Sun Apr 10, 2014  8:03 PM       Let patient know that STD's - HIV, STS, Hep B, GC, Chl were all negative ------

## 2014-04-12 NOTE — Telephone Encounter (Signed)
I have attempted to contact this patient by phone with the following results: left message to return my call on answering machine (home/mobile per DPR).  

## 2014-04-13 ENCOUNTER — Other Ambulatory Visit: Payer: Self-pay | Admitting: Certified Nurse Midwife

## 2014-04-13 DIAGNOSIS — R87810 Cervical high risk human papillomavirus (HPV) DNA test positive: Principal | ICD-10-CM

## 2014-04-13 DIAGNOSIS — R87610 Atypical squamous cells of undetermined significance on cytologic smear of cervix (ASC-US): Secondary | ICD-10-CM

## 2014-04-13 LAB — IPS PAP TEST WITH HPV

## 2014-04-20 ENCOUNTER — Telehealth: Payer: Self-pay | Admitting: Emergency Medicine

## 2014-04-20 NOTE — Telephone Encounter (Signed)
Patient notified of message from Regina Eck CNM. She is agreeable to scheduling colposcopy. Brief description of procedure given to patient.  Colposcopy pre-procedure instructions given. Motrin instructions given. Motrin=Advil=Ibuprofen Can take 800 mg (Can purchase over the counter, you will need four 200 mg pills). Take with food. Make sure to eat a meal before appointment and drink plenty of fluids. Patient verbalized understanding and will call to reschedule if will be on menses or has any concerns regarding pregnancy. Advised will need to cancel within 24 hours or will have $100.00 no show fee placed to account.  Patient with Tubal Ligation.  LMP 04/11/14.  Copayment: $35.00-Patient notified.  Notified of cancellation policy.  Procedure room scheduled. Linked order to appointment.

## 2014-04-20 NOTE — Telephone Encounter (Signed)
Message copied by Michele Mcalpine on Wed Apr 20, 2014  1:31 PM ------      Message from: Regina Eck      Created: Wed Apr 13, 2014  9:12 AM       Notify patient that pap smear is abnormal again and needs colposcopy evaluation. Pap smear is ASCUS with positive HPVHR. Patient has colposcopy before in 2012      Order in ------

## 2014-05-04 ENCOUNTER — Encounter: Payer: Self-pay | Admitting: Certified Nurse Midwife

## 2014-05-04 ENCOUNTER — Ambulatory Visit (INDEPENDENT_AMBULATORY_CARE_PROVIDER_SITE_OTHER): Payer: BC Managed Care – PPO | Admitting: Certified Nurse Midwife

## 2014-05-04 VITALS — BP 110/78 | HR 70 | Resp 16 | Ht 65.25 in | Wt 215.0 lb

## 2014-05-04 DIAGNOSIS — N760 Acute vaginitis: Secondary | ICD-10-CM

## 2014-05-04 DIAGNOSIS — R87810 Cervical high risk human papillomavirus (HPV) DNA test positive: Secondary | ICD-10-CM

## 2014-05-04 DIAGNOSIS — R87610 Atypical squamous cells of undetermined significance on cytologic smear of cervix (ASC-US): Secondary | ICD-10-CM

## 2014-05-04 MED ORDER — METRONIDAZOLE 500 MG PO TABS: 500.0000 mg | ORAL_TABLET | Freq: Two times a day (BID) | ORAL | Status: DC

## 2014-05-04 NOTE — Progress Notes (Addendum)
Patient ID: Chelsea Short, female   DOB: [birth year], 38 y.o.   MRN: [id number]  Chief Complaint  Patient presents with  . Colposcopy    HPI Chelsea Short is a hispanic 38 y.o. female legally separated [id number] here for colposcopy exam.  HPI  Indications: Pap smear on 5/6 2015 showed: ASCUS positive HPVHR. Previous colposcopy: 2006 and in 2012 showed negative colpo with HPV, negative ECC. Prior cervical treatment: none..  Past Medical History  Diagnosis Date  . Migraine without aura   . Abnormal Pap smear  2006, 2012    2006 abnormal pap; 04/25/11 ASCUS, colpo bx neg, 5/15 ASCUS +HPV  . HSV-1 infection     Past Surgical History  Procedure Laterality Date  . Vaginal delivery    . Colposcopy  2012    ASCUS, 2006  . Breast surgery Bilateral 2009    bilateral breast reduction  . Tubal ligation  2006    BTL    Family History  Problem Relation Age of Onset  . Thyroid disease Mother   . Hypertension Father   . Thyroid disease Sister   . Hypertension Maternal Grandmother   . Diabetes Maternal Grandmother   . Cancer Maternal Grandfather     pancreatic  . Diabetes Paternal Grandmother     Social History History  Substance Use Topics  . Smoking status: Never Smoker   . Smokeless tobacco: Not on file  . Alcohol Use: No    Allergies  Allergen Reactions  . Diflucan [Fluconazole]     Oral ulcers     Current Outpatient Prescriptions  Medication Sig Dispense Refill  . aspirin-acetaminophen-caffeine (EXCEDRIN MIGRAINE) 250-250-65 MG per tablet Take 2 tablets by mouth every 6 (six) hours as needed for pain.      [name] ibuprofen (ADVIL,MOTRIN) 200 MG tablet Take 1,200 mg by mouth 2 (two) times daily as needed for pain.      . Multiple Vitamin (MULTIVITAMIN) tablet Take 1 tablet by mouth daily.       No current facility-administered medications for this visit.    Review of Systems Review of Systems  Constitutional: Negative.   Genitourinary: Negative for vaginal bleeding,  vaginal discharge and vaginal pain.    Blood pressure 110/78, pulse 70, resp. rate 16, height 5' 5.25" (1.657 m), weight 215 lb (97.523 kg), last menstrual period 04/08/2014.  Physical Exam Physical Exam  Constitutional: She is oriented to person, place, and time. She appears well-developed and well-nourished.  Genitourinary: Vaginal discharge found.    Neurological: She is alert and oriented to person, place, and time.  Skin: Skin is warm and dry.  Psychiatric: She has a normal mood and affect. Her behavior is normal. Judgment normal.    Data Reviewed Reviewed pap smear results and possible BV infection. Questions addressed.  Assessment  ASCUS pap smear with positive HPVHR here for colposcopy History of previous ASCUS with positive HPVHR with negative colposcopy   Wet Prep positive for BV Procedure Details  The risks and benefits of the procedure and Written informed consent obtained.  Speculum placed in vagina and excellent visualization of cervix achieved. Wet prep taken. Cervix swabbed x 3 with saline solution and acetic acid solution. Rough texture lesion noted at 1 o'clock. Acetowhite response noted at 7,11 o'clock. Lugol's applied with non staining noted in same areas. Biopsy taken at 7,11,1 o'clock. ECC obtained. Monsel's applied. No active bleeding noted. Speculum removed. Patient tolerated procedure well. Instructions and folic acid handout given. Reviewed  With patient that BV present.  Rx Flagyl see order with instructions.  Specimens: 4  Complications: none.     Plan    Specimens labelled and sent to Pathology. Pathology will be reviewed and patient will be notified of results.  Pathology reviewed: Biopsy at 7,1 o'clock showed benign squamous epithelium with HPV related cytopathic change, negative for dysplasia. Biopsy at 11 o'clock showed no dysplasia ECC benign cervical glandular epithelium, squamous epithelium negative for dysplasia or malignancy. Patient will  be notified. Pap recall one year.      Regina Eck 05/04/2014, 2:24 PM

## 2014-05-04 NOTE — Patient Instructions (Signed)

## 2014-05-04 NOTE — Progress Notes (Signed)
Previous hx of abnormal pap smears. Colposcopy 2006 & 2012. Pap on 04-06-14 ASCUS +HPV

## 2014-05-06 LAB — IPS OTHER TISSUE BIOPSY

## 2014-05-06 NOTE — Progress Notes (Signed)
Reviewed personally.  M. Suzanne Keller Bounds, MD.  

## 2014-10-03 ENCOUNTER — Encounter: Payer: Self-pay | Admitting: Certified Nurse Midwife

## 2014-11-12 IMAGING — CT CT HEAD W/O CM
4 of 6 series · 14 of 33 positions shown, 16 images · non-contrast
Comparison: None.

CLINICAL DATA: Migraine

EXAM:
CT HEAD WITHOUT CONTRAST
TECHNIQUE: Contiguous axial images were obtained from the base of the skull
through the vertex without intravenous contrast.

[Series 4: neck 2.0 i31s 3 · axial · 0.40mm/px · z∈[-335,-219]mm · 3 of 118 slices shown, 4 images]
[im 30/118  soft-tissue]
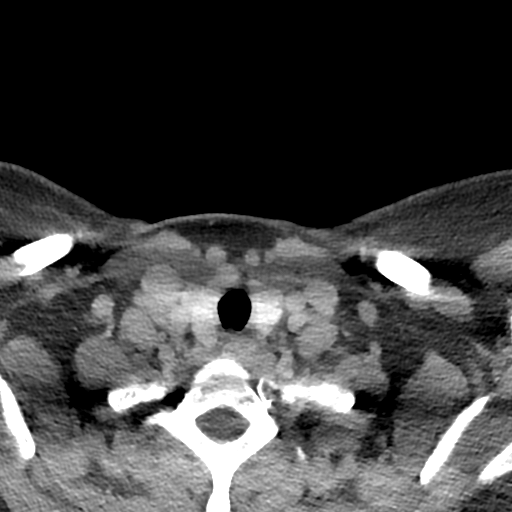
[im 30/118  bone]
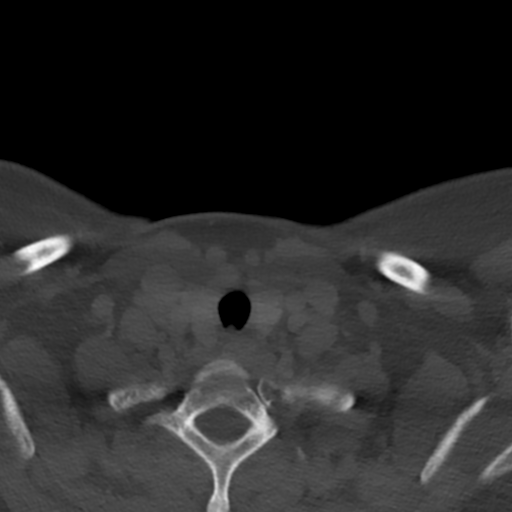
[im 59/118  bone]
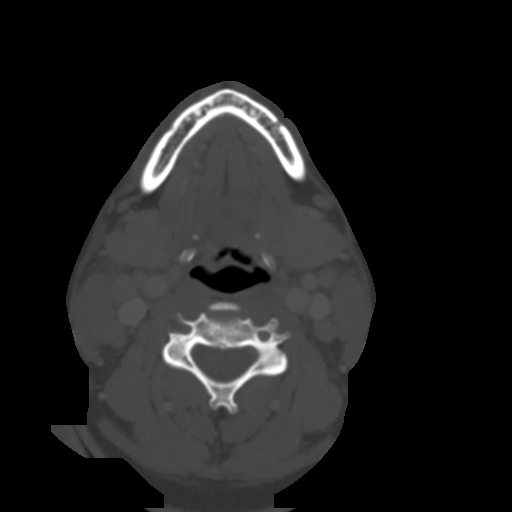
[im 88/118  bone]
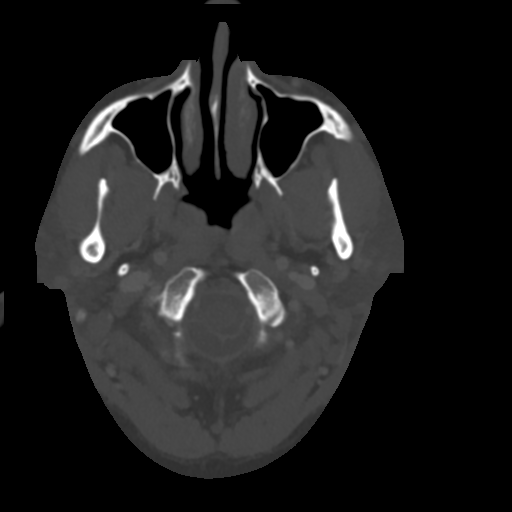

[Series 7: coronal st · coronal · 0.40mm/px · 3 of 101 slices shown]
[im 21/101  bone]
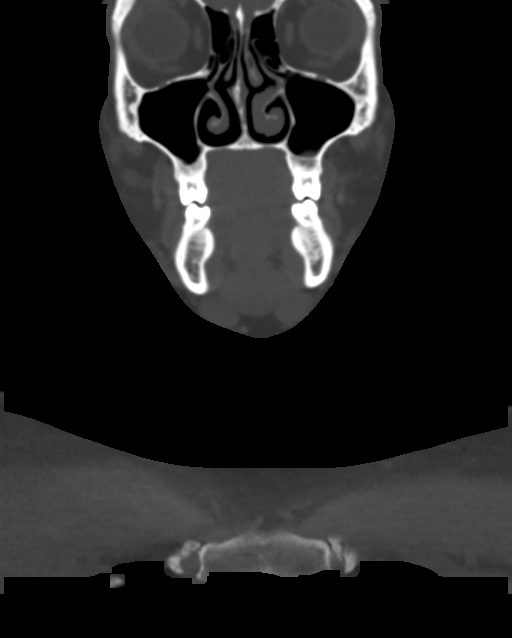
[im 41/101  bone]
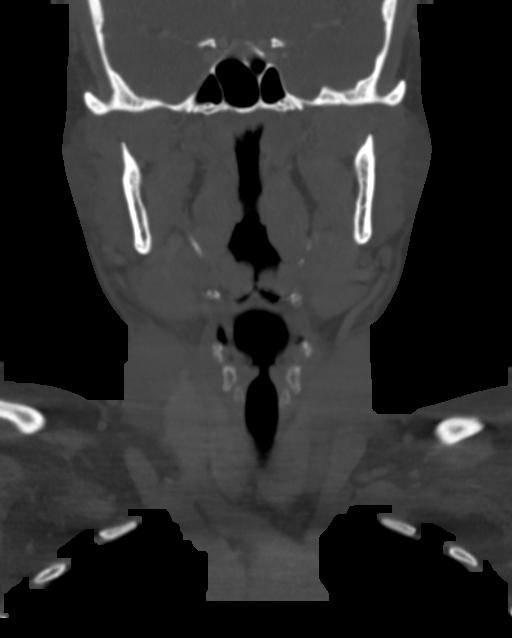
[im 61/101  bone]
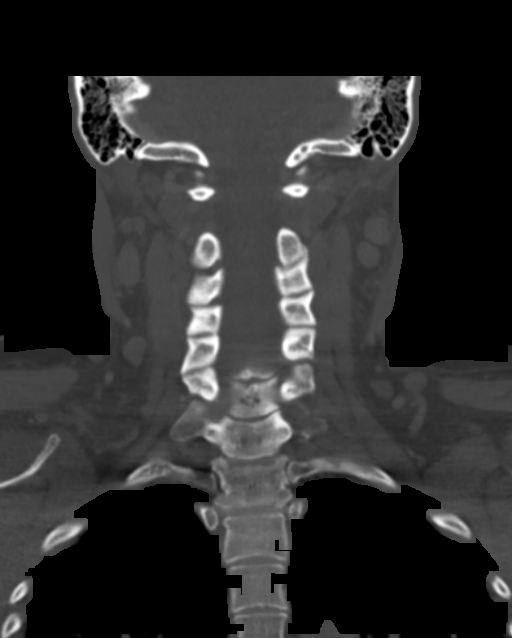

[Series 8: sagittal st · sagittal · 0.43mm/px · 5 of 101 slices shown, 6 images]
[im 34/101  bone]
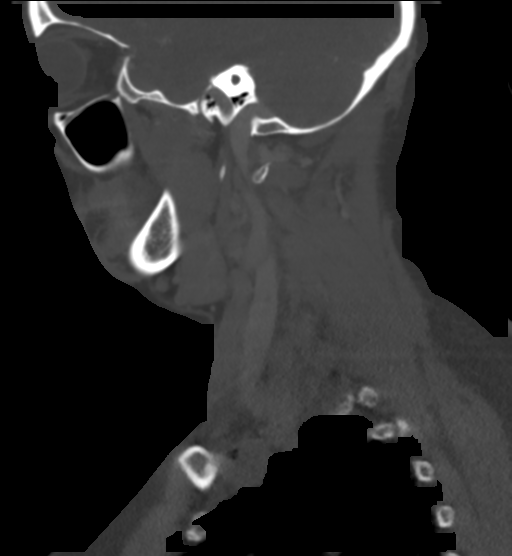
[im 42/101  bone]
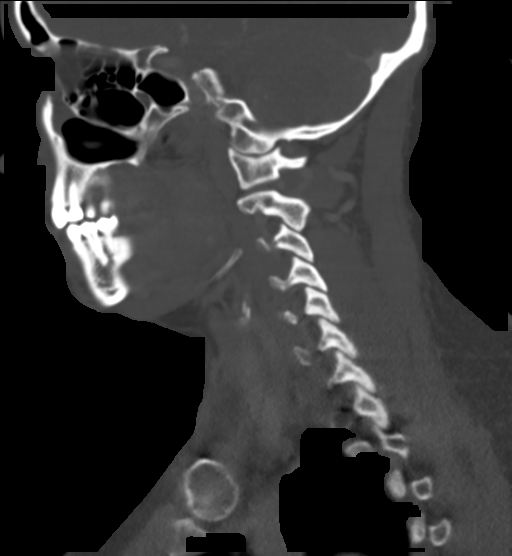
[im 51/101  soft-tissue]
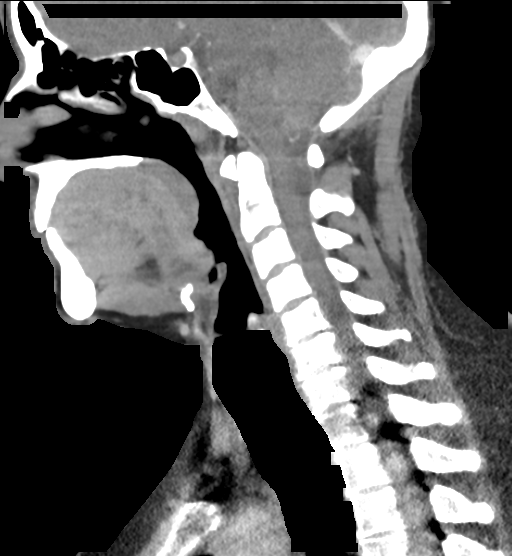
[im 51/101  bone]
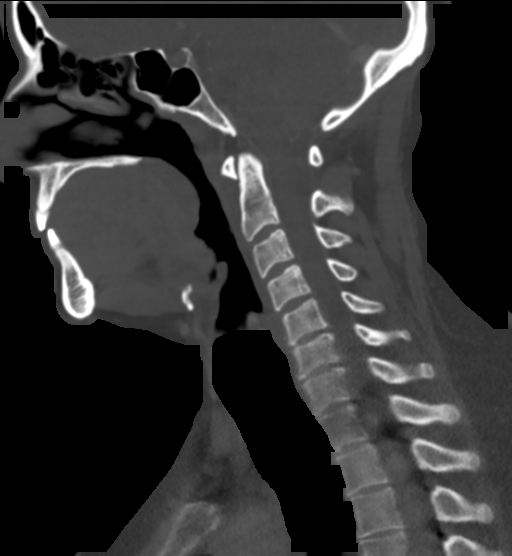
[im 59/101  bone]
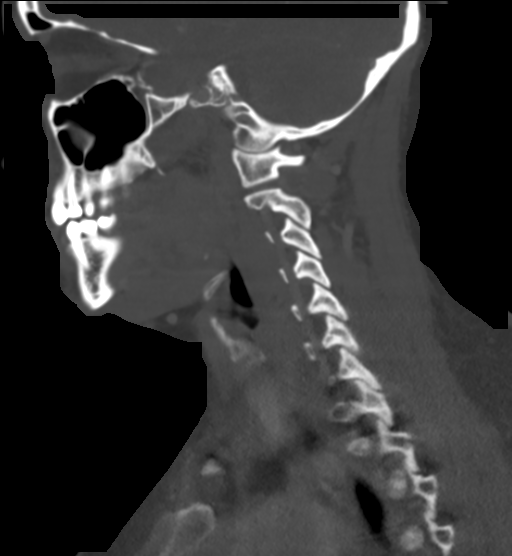
[im 67/101  bone]
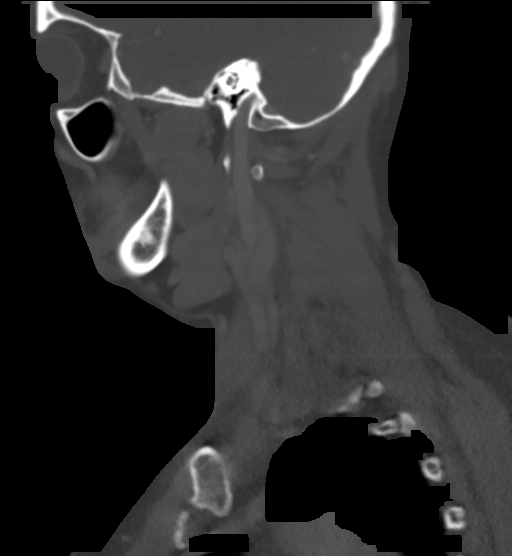

[Series 9: orthogonal st · axial · 0.39mm/px · z∈[-353,-242]mm · 3 of 117 slices shown]
[im 30/117  bone]
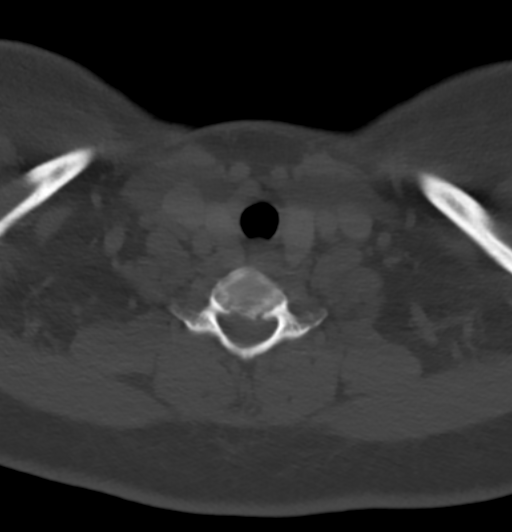
[im 59/117  bone]
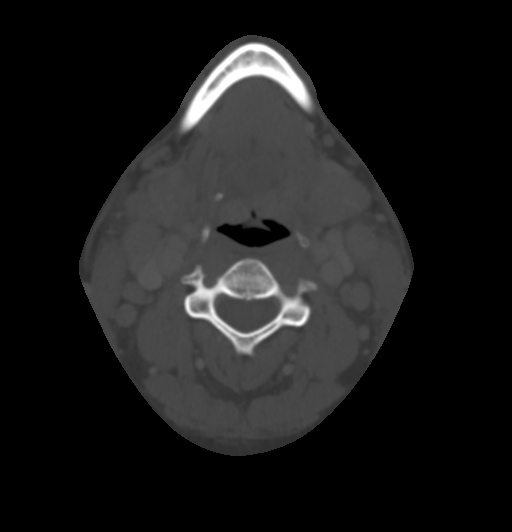
[im 88/117  bone]
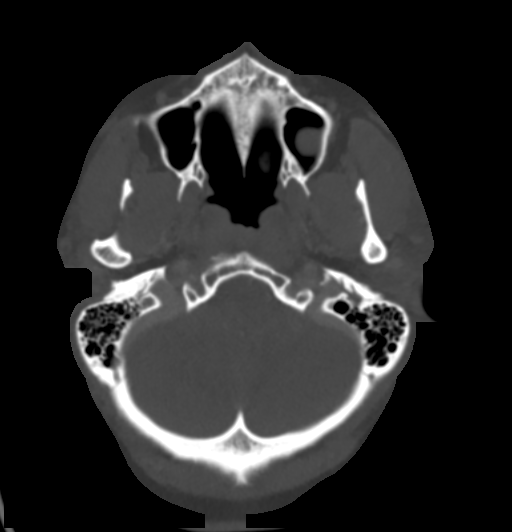

[14 of 33 positions shown; findings below may reference images not displayed]

FINDINGS: No skull fracture is noted. Paranasal sinuses and mastoid air cells
are unremarkable.

No intracranial hemorrhage, mass effect or midline shift.

No hydrocephalus. The gray and white-matter differentiation is
preserved.

No acute infarction. No mass lesion is noted on this unenhanced
scan. No intra or extra-axial fluid collection.
IMPRESSION: No acute intracranial abnormality.

## 2015-04-24 ENCOUNTER — Encounter: Payer: Self-pay | Admitting: Nurse Practitioner

## 2015-04-24 ENCOUNTER — Ambulatory Visit (INDEPENDENT_AMBULATORY_CARE_PROVIDER_SITE_OTHER): Payer: BC Managed Care – PPO | Admitting: Nurse Practitioner

## 2015-04-24 VITALS — BP 134/78 | HR 80 | Resp 20 | Ht 65.0 in | Wt 219.6 lb

## 2015-04-24 DIAGNOSIS — Z01419 Encounter for gynecological examination (general) (routine) without abnormal findings: Secondary | ICD-10-CM | POA: Diagnosis not present

## 2015-04-24 DIAGNOSIS — Z113 Encounter for screening for infections with a predominantly sexual mode of transmission: Secondary | ICD-10-CM | POA: Diagnosis not present

## 2015-04-24 DIAGNOSIS — Z0000 Encounter for general adult medical examination without abnormal findings: Secondary | ICD-10-CM

## 2015-04-24 DIAGNOSIS — N930 Postcoital and contact bleeding: Secondary | ICD-10-CM | POA: Diagnosis not present

## 2015-04-24 LAB — POCT URINALYSIS DIPSTICK
LEUKOCYTES UA: NEGATIVE
PH UA: 5
Urobilinogen, UA: NEGATIVE

## 2015-04-24 LAB — HEMOGLOBIN, FINGERSTICK: Hemoglobin, fingerstick: 13.2 g/dL (ref 12.0–16.0)

## 2015-04-24 NOTE — Progress Notes (Signed)
39 y.o. [id number] Legally Separated  Hispanic Fe here for annual exam.  Menses moderate to light and last 6-7 days.  No cramps. Some PMS.  Back with same partner. He was unfaithful in past but not now.  Post coital bleeding X 4 months.  Moderate amount of spotting but using panty liner only for that day.  She has no associated pain.  Patient's last menstrual period was 04/18/2015.          Sexually active: Yes.    The current method of family planning is condoms all of the time.    Exercising: No.  The patient does not participate in regular exercise at present. Smoker:  no  Health Maintenance: Pap:  04/06/14 ASCUS  + HPV ; had colpo Bx done 05/04/14 HPV changes TDaP:  04/06/14 Labs: Hgb: 13.2 ; Urine: RBC's ++(Menses)    reports that she has never smoked. She does not have any smokeless tobacco history on file. She reports that she drinks alcohol. She reports that she does not use illicit drugs.  Past Medical History  Diagnosis Date  . Migraine without aura   . Abnormal Pap smear  2006, 2012    2006 abnormal pap; 04/25/11 ASCUS, colpo bx neg, 5/15 ASCUS +HPV  . HSV-1 infection     Past Surgical History  Procedure Laterality Date  . Vaginal delivery    . Colposcopy       2006, 2012 neg & ecc neg  . Breast surgery Bilateral 2009    bilateral breast reduction  . Tubal ligation  2006    BTL    Current Outpatient Prescriptions  Medication Sig Dispense Refill  . ibuprofen (ADVIL,MOTRIN) 200 MG tablet Take 1,200 mg by mouth 2 (two) times daily as needed for pain.    . Multiple Vitamin (MULTIVITAMIN) tablet Take 1 tablet by mouth daily.    [name] aspirin-acetaminophen-caffeine (EXCEDRIN MIGRAINE) 250-250-65 MG per tablet Take 2 tablets by mouth every 6 (six) hours as needed for pain.     No current facility-administered medications for this visit.    Family History  Problem Relation Age of Onset  . Thyroid disease Mother   . Hypertension Father   . Thyroid disease Sister   . Hypertension  Maternal Grandmother   . Diabetes Maternal Grandmother   . Cancer Maternal Grandfather     pancreatic  . Diabetes Paternal Grandmother     ROS:  Pertinent items are noted in HPI.  Otherwise, a comprehensive ROS was negative.  Exam:   BP 134/78 mmHg  Pulse 80  Resp 20  Ht 5\' 5"  (1.651 m)  Wt 219 lb 9.6 oz (99.61 kg)  BMI 36.54 kg/m2  LMP 04/18/2015 Height: 5\' 5"  (165.1 cm) Ht Readings from Last 3 Encounters:  04/24/15 5\' 5"  (1.651 m)  05/04/14 5' 5.25" (1.657 m)  04/06/14 5' 5.25" (1.657 m)    General appearance: alert, cooperative and appears stated age Head: Normocephalic, without obvious abnormality, atraumatic Neck: no adenopathy, supple, symmetrical, trachea midline and thyroid normal to inspection and palpation Lungs: clear to auscultation bilaterally Breasts: normal appearance, no masses or tenderness Heart: regular rate and rhythm Abdomen: soft, non-tender; no masses,  no organomegaly Extremities: extremities normal, atraumatic, no cyanosis or edema Skin: Skin color, texture, turgor normal. No rashes or lesions Lymph nodes: Cervical, supraclavicular, and axillary nodes normal. No abnormal inguinal nodes palpated Neurologic: Grossly normal   Pelvic: External genitalia:  no lesions  Urethra:  normal appearing urethra with no masses, tenderness or lesions              Bartholin's and Skene's: normal                 Vagina: normal appearing vagina with normal color and discharge, no lesions              Cervix: anteverted              Pap taken: Yes.   Bimanual Exam:  Uterus:  normal size, contour, position, consistency, mobility, non-tender              Adnexa: no mass, fullness, tenderness               Rectovaginal: Confirms               Anus:  normal sphincter tone, no lesions  Chaperone present: Yes/NO  A:  Well Woman with normal exam  S/P BTL 2006 S/P bilateral breast reduction 2009  Post coital bleeding X 4  months R/O STD's Relationship discord in the past    P:   Reviewed health and wellness pertinent to exam  Pap smear as above  Mammogram at age 57  Will follow with STD's as possible cause of post coital bleeding  If those test are negative will proceed with PUS/ SHGM  Counseled on breast self exam, mammography screening, adequate intake of calcium and vitamin D, diet and exercise return annually or prn  An After Visit Summary was printed and given to the patient.

## 2015-04-24 NOTE — Patient Instructions (Signed)

## 2015-04-25 ENCOUNTER — Telehealth: Payer: Self-pay | Admitting: Nurse Practitioner

## 2015-04-25 LAB — STD PANEL
HEP B S AG: NEGATIVE
HIV: NONREACTIVE

## 2015-04-25 NOTE — Telephone Encounter (Signed)
Call to patient. Advised of benefit quote received for [facility]. Patient agreeable. States that she will proceed with scheduling once pap results have come back.

## 2015-04-26 LAB — IPS N GONORRHOEA AND CHLAMYDIA BY PCR

## 2015-04-26 LAB — IPS PAP TEST WITH HPV

## 2015-04-30 NOTE — Progress Notes (Signed)
Encounter reviewed by Dr. Josue Kass Silva.  

## 2015-05-02 ENCOUNTER — Telehealth: Payer: Self-pay | Admitting: Certified Nurse Midwife

## 2015-05-02 ENCOUNTER — Telehealth: Payer: Self-pay | Admitting: Emergency Medicine

## 2015-05-02 DIAGNOSIS — IMO0002 Reserved for concepts with insufficient information to code with codable children: Secondary | ICD-10-CM

## 2015-05-02 NOTE — Telephone Encounter (Signed)
Spoke with patient and result note from [place] Cage, [place] given.   Pap results 04/24/15:  Atypical squamous cells of undetermined significance (ASC-US). 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  Condoms only for birth control.  LMP 04/18/15.   Patient is advised colposcopy will be completed first, will schedule with MD for evaluation prior to cycle day 12. Then, will plan for Sonohysterogram with same MD for cycles day 1-9. Sonohysterogram has been pre-certed for insurance and patient aware of cost. Patient advised to call back on first day of cycle to schedule colposcopy to be planned prior to cycle day 12. Patient agreeable, will call back with first day of cycle, if starts on weekend, will call first thing Monday morning.    cc Daryll Drown aware of need for colposcopy, can be contacted with  insurance pre certification.

## 2015-05-02 NOTE — Telephone Encounter (Signed)
Patient is calling for recent lab results °

## 2015-05-02 NOTE — Telephone Encounter (Signed)
Pre-cert complete

## 2015-05-02 NOTE — Telephone Encounter (Signed)
-----   Message from [place], [place] sent at 04/27/2015  7:56 AM EDT ----- This pt. needs to have a repeat colpo biopsy - last one done 05/2014.  She also needs to proceed as planned for PUS/ SHGM to assess the AUB.  The STD labs's with HIV, Hep B, STS, GC and Chl is all negative.

## 2015-05-15 ENCOUNTER — Telehealth: Payer: Self-pay | Admitting: Certified Nurse Midwife

## 2015-05-15 DIAGNOSIS — IMO0002 Reserved for concepts with insufficient information to code with codable children: Secondary | ICD-10-CM

## 2015-05-15 NOTE — Telephone Encounter (Signed)
Patient was told to call when she started her cycle for a procedure. Last seen 04/24/15.

## 2015-05-15 NOTE — Telephone Encounter (Signed)
Spoke with patient. Patient started her cycle on 05/14/2015 and would like to schedule her colposcopy procedure with Dr.Miller. Patient requesting appointment next Monday 05/22/2015 as this is her off day. Advised patient will need to speak with provider regarding availability as she will be performing surgery that AM. Patient is agreeable and verbalizes understanding.

## 2015-05-16 NOTE — Telephone Encounter (Signed)
Spoke with patient. Colposcopy scheduled for Monday 6/20 at 10:30am with Dr.Silva (date and time per Gay Filler). Patient is agreeable to date and time.   Instructions given. Motrin 800 mg po x , one hour before appointment with food. Make sure to eat a meal before appointment and drink plenty of fluids. Patient verbalized understanding and will call to reschedule if will be on menses or has any concerns regarding pregnancy.   Routing to provider for final review. Patient agreeable to disposition. Will close encounter.

## 2015-05-22 ENCOUNTER — Ambulatory Visit (INDEPENDENT_AMBULATORY_CARE_PROVIDER_SITE_OTHER): Payer: BC Managed Care – PPO | Admitting: Obstetrics and Gynecology

## 2015-05-22 ENCOUNTER — Encounter: Payer: Self-pay | Admitting: Obstetrics and Gynecology

## 2015-05-22 VITALS — BP 120/84 | HR 64 | Resp 16 | Wt 219.0 lb

## 2015-05-22 DIAGNOSIS — N9089 Other specified noninflammatory disorders of vulva and perineum: Secondary | ICD-10-CM | POA: Diagnosis not present

## 2015-05-22 DIAGNOSIS — IMO0002 Reserved for concepts with insufficient information to code with codable children: Secondary | ICD-10-CM

## 2015-05-22 DIAGNOSIS — R896 Abnormal cytological findings in specimens from other organs, systems and tissues: Secondary | ICD-10-CM

## 2015-05-22 NOTE — Progress Notes (Signed)
Patient ID: Chelsea Short, female   DOB: 1976/06/09, 39 y.o.   MRN: [id number] GYNECOLOGY  VISIT   HPI: 39 y.o.   Legally Separated  Hispanic  female   [phone] with Patient's last menstrual period was 05/13/2015.   here for   coloposcopy  GYNECOLOGIC HISTORY: Patient's last menstrual period was 05/13/2015. Contraception:Tubal Ligation and condoms  Menopausal hormone therapy: N/A Last mammogram: never  Last pap smear: 04-24-15 ASCUS POSITIVE HR HPV         OB History    Gravida Para Term Preterm AB TAB SAB Ectopic Multiple Living   4 1   3 1 2   1          There are no active problems to display for this patient.   Past Medical History  Diagnosis Date  . Migraine without aura   . Abnormal Pap smear  2006, 2012    2006 abnormal pap; 04/25/11 ASCUS, colpo bx neg, 5/15 ASCUS +HPV  . HSV-1 infection     Past Surgical History  Procedure Laterality Date  . Vaginal delivery    . Colposcopy       2006, 2012 neg & ecc neg  . Breast surgery Bilateral 2009    bilateral breast reduction  . Tubal ligation  2006    BTL    Current Outpatient Prescriptions  Medication Sig Dispense Refill  . aspirin-acetaminophen-caffeine (EXCEDRIN MIGRAINE) 250-250-65 MG per tablet Take 2 tablets by mouth every 6 (six) hours as needed for pain.    [name] ibuprofen (ADVIL,MOTRIN) 200 MG tablet Take 1,200 mg by mouth 2 (two) times daily as needed for pain.    . Multiple Vitamin (MULTIVITAMIN) tablet Take 1 tablet by mouth daily.     No current facility-administered medications for this visit.     ALLERGIES: Clindamycin/lincomycin and Diflucan  Family History  Problem Relation Age of Onset  . Thyroid disease Mother   . Hypertension Father   . Thyroid disease Sister   . Hypertension Maternal Grandmother   . Diabetes Maternal Grandmother   . Cancer Maternal Grandfather     pancreatic  . Diabetes Paternal Grandmother     History   Social History  . Marital Status: Legally Separated    Spouse  Name: N/A  . Number of Children: N/A  . Years of Education: N/A   Occupational History  . Not on file.   Social History Main Topics  . Smoking status: Never Smoker   . Smokeless tobacco: Never Used  . Alcohol Use: 0.0 oz/week    0 Standard drinks or equivalent per week     Comment: 1-2 glasses of wine/month  . Drug Use: No  . Sexual Activity:    Partners: Male    Birth Control/ Protection: Surgical     Comment: tubal ligation   Other Topics Concern  . Not on file   Social History Narrative    ROS:  Pertinent items are noted in HPI.  PHYSICAL EXAMINATION:    BP 120/84 mmHg  Pulse 64  Resp 16  Wt 219 lb (99.338 kg)  LMP 05/13/2015    General appearance: alert, cooperative and appears stated age    See colposcopy note.             Chaperone was present for exam.  ASSESSMENT  ASCUS and positive HR HPV. Vulvar and perianal lesions.  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  PLAN  Discussion of HPV subtypes.  Follow up biopsies.  Instructions/precautions given. Discussed possible treatment options of  condyloma if needed.  At a minimum, I expect co-testing in one year.   An After Visit Summary was printed and given to the patient.

## 2015-05-22 NOTE — Patient Instructions (Signed)
Colposcopy Care After Colposcopy is a procedure in which a special tool is used to magnify the surface of the cervix. A tissue sample (biopsy) may also be taken. This sample will be looked at for cervical cancer or other problems. After the test:  You may have some cramping.  Lie down for a few minutes if you feel lightheaded.   You may have some bleeding which should stop in a few days. HOME CARE  Do not have sex or use tampons for 2 to 3 days or as told.  Only take medicine as told by your doctor.  Continue to take your birth control pills as usual. Finding out the results of your test Ask when your test results will be ready. Make sure you get your test results. GET HELP RIGHT AWAY IF:  You are bleeding a lot or are passing blood clots.  You develop a fever of 102 F (38.9 C) or higher.  You have abnormal vaginal discharge.  You have cramps that do not go away with medicine.  You feel lightheaded, dizzy, or pass out (faint). MAKE SURE YOU:   Understand these instructions.  Will watch your condition.  Will get help right away if you are not doing well or get worse. Document Released: 05/06/2008 Document Revised: 02/10/2012 Document Reviewed: 06/17/2013 [facility] Patient Information 2015 [place], [place]. This information is not intended to replace advice given to you by your health care provider. Make sure you discuss any questions you have with your health care provider.  VULVAR BIOPSY POST-PROCEDURE INSTRUCTIONS  1. You may take Ibuprofen, Aleve or Tylenol for pain if needed.    2. You may have a small amount of spotting.  You should wear a mini pad for the next few days.  3. You may use some topical Neosporin ointment if you would like (over the counter is fine).  4. You need to call if you have redness around the biopsy site, if there is any unusual draining, if the bleeding is heavy, or if you are concerned.  5. Shower or bathe as normal  6. We will call you  within one week with results or we will discuss the results at your follow-up appointment if needed.

## 2015-05-22 NOTE — Progress Notes (Signed)
Subjective:     Patient ID: Chelsea Short, female   DOB: 11-May-1976, 39 y.o.   MRN: [id number]  HPI  39 year old P1 here for colposcopy for 04-24-15 ASCUS POSITVIE HR HPV. LMP 05/13/15.  Prior abnormal paps. Had colposcopy in 2006 and 2012 - HPV changes and ECC negative.  Pap 04/06/14 - ASCUS and positive HR HPV.  Colposcopy 05/04/14 - biopsies showed HPV changes/no dysplasia and ECC negative.  No prior treatment.   Status post BTL and uses condoms.   Not taking any blood thinners.   Review of Systems     Objective:   Physical Exam  Genitourinary:        Colposcopy Consent for procedure.  Speculum placed.  Acetic acid 3% used.  Satisfactory colposcopy.  ECC done.  Acetowhite staining at 12:00.  Biopsy done.  Monsel's placed.  2 specimens to pathology separately.   Vulvar colposcopy done.  Thickened acetowhte change on inferior right vulva near perineum.  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  Local 1% lidocaine - lot [id number]. Exp 12/03/15. 3 mm punch biopsy or right vulva.   Tissue to pathology. AgNO3 used.   Minimal EBL.  No complications.     Assessment:     ASCUS and positive HR HPV.  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    Plan:     Discussion of HPV subtypes.  Follow up biopsies.  Instructions/precautions given. Discussed possible treatment options of condyloma if needed.  At a minimum, I expect co-testing in one year.

## 2015-05-24 LAB — IPS OTHER TISSUE BIOPSY

## 2015-05-25 ENCOUNTER — Telehealth: Payer: Self-pay

## 2015-05-25 NOTE — Telephone Encounter (Signed)
Spoke with patient. Advised per Dr.Silva okay for pap and recheck at appointment on 11/08/2015. Patient is agreeable.  Will close encounter.

## 2015-05-25 NOTE — Telephone Encounter (Signed)
Left message to call Kaitlyn at [phone]. 

## 2015-05-25 NOTE — Telephone Encounter (Signed)
-----   Message from Nunzio Cobbs, MD sent at 05/25/2015  8:21 AM EDT ----- Please inform patient of colposcopy results.  ECC benign.  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  This area was symptomatic for the patient and is a raised area.   I do recommend a follow up visit for me to re-examine patient and discuss with her potential excision of the vulvar area.  Please place in recall for 6 months also.

## 2015-05-25 NOTE — Telephone Encounter (Signed)
Spoke with patient. Advised of message and results as seen below from [place]. Patient verbalizes understanding. Patient declines to schedule follow up at this time. Would like to schedule 6 month pap and discuss excision at that time. "It only itches when I am about to start my period. The symptoms are not bad." 6 month appointment scheduled for 11/08/2015 at 8:15am with Dr.Silva. Patient is agreeable to date and time. 06 recall placed. Advised will speak with Dr.Silva regarding waiting until December to discuss excision as may need to be seen earlier. Patient is agreeable.

## 2015-05-25 NOTE — Telephone Encounter (Signed)
I will re-evaluate in December with pap and recheck then.

## 2015-05-30 MED ORDER — METRONIDAZOLE 500 MG PO TABS: 500.0000 mg | ORAL_TABLET | Freq: Two times a day (BID) | ORAL | Status: DC

## 2015-05-30 NOTE — Telephone Encounter (Signed)
Order placed for Flagyl 500 mg bid for 7 days #14 0RF to [place] on file. Spoke with patient. Advised rx has been sent to pharmacy. Patient is agreeable and verbalizes understanding.  Routing to provider for final review. Patient agreeable to disposition. Will close encounter.

## 2015-05-30 NOTE — Telephone Encounter (Signed)
Spoke with patient. Patient was last seen for colposcopy with Dr.Silva on 05/22/2015. Patient calling to state she has been experiencing clear vaginal discharge with a fishy odor since Sunday. States she has a history of bacterial infections. "The smell is so strong. It is embarrassing." Patient requesting rx for Flagyl due to history and being seen recently with Dr.Silva. Is going out of town tomorrow for a wedding this weekend. Advised will send a message over to Dr.Silva and return call with further recommendations.

## 2015-05-30 NOTE — Telephone Encounter (Signed)
Ok for Flagyl 500 mg po bid for 7 days.  Please send to pharmacy of choice.

## 2015-05-30 NOTE — Telephone Encounter (Signed)
Patient wants to talk with [facility] no information given

## 2015-06-06 ENCOUNTER — Telehealth: Payer: Self-pay | Admitting: Nurse Practitioner

## 2015-06-06 DIAGNOSIS — N930 Postcoital and contact bleeding: Secondary | ICD-10-CM

## 2015-06-06 NOTE — Telephone Encounter (Signed)
Dr. Quincy Simmonds,  Patient completed colposcopy 05/22/15.  Chelsea Cage, FNP ordered Sonohysterogram on 04/24/15.  Can you review order? Will she need to be scheduled for Sonohysterogram or Pelvic ultrasound only?  Okay to schedule at any time if needs Sonohysterogram? Patient with hx of BTL.

## 2015-06-06 NOTE — Telephone Encounter (Signed)
Please follow up with patient based on following referral message:  Patient scheduled for procedure on 6/20. Per Olivia Mackie patient needs a week to recover before scheduling [place]. Must be scheduled with next cycle prior to day 9. Ok per [place] to defer 2 weeks. Check status of procedure results and patient cycle prior to scheduling. Note to triage to follow up with patient to schedule. Please notify referrals for precert

## 2015-06-07 NOTE — Telephone Encounter (Signed)
Please schedule for pelvic ultrasound and possible sonohysterogram.  We will start with the ultrasound and then progress to the sonohysterogram at that same time if needed. Thank you.

## 2015-06-07 NOTE — Telephone Encounter (Signed)
Call to patient. She is agreeable to scheduling Pelvic ultrasound and Sonohysterogram for post coidal bleeding.  Brief explanation of procedure given. Patient confirms hx BTL. LMP 05/14/15.  Discussed possible dates for procedure, patient expects cycle next week, week of 7/21 patient has work commitments. Agreeable to appointment with Dr. Quincy Simmonds 06/29/15 and this is scheduled. Patient verbalized understanding of the [facility] appointment cancellation policy. Advised will need to cancel or reschedule within 72 business hours of appointment (3 business days) or will have late cancellation fee placed to account $150.00 for Sonohysterogram.   Advised patient that insurance co-ordinator will return her call to discuss insurance coverage of procedure. Patient agreeable.   Routing to provider for final review. Patient agreeable to disposition. Will close encounter.      cc Kerry Hough

## 2015-06-22 ENCOUNTER — Telehealth: Payer: Self-pay | Admitting: Obstetrics and Gynecology

## 2015-06-22 NOTE — Telephone Encounter (Signed)
Thank you for the update.  Cc- Sally Yeakley 

## 2015-06-22 NOTE — Telephone Encounter (Signed)
Patient called to cancel [facility] scheduled for 06/29/15 due to work reasons. States she is traveling quite a bit over the next month and is unable to keep appointment, however she is planning to call to reschedule in mid august.  Routing to Dr Quincy Simmonds and Gay Filler for review.

## 2015-06-29 ENCOUNTER — Other Ambulatory Visit: Payer: BC Managed Care – PPO

## 2015-06-29 ENCOUNTER — Other Ambulatory Visit: Payer: BC Managed Care – PPO | Admitting: Obstetrics and Gynecology

## 2015-07-20 NOTE — Telephone Encounter (Signed)
It has been about one month since she canceled her appointment. Please contact her to see if she is ready to reschedule.

## 2015-07-21 NOTE — Telephone Encounter (Signed)
Call to patient. Based on menses due approximately 08-02-15 and condoms for contraception, procedure needs to move up one week. Appointment rescheduled to 08-10-15 at 830. Patient is agreeable to date and time. Advised her to call with onset of menses so we can confirm this will work for [facility]. Patient voiced understanding.  Routing to provider for final review.  Will close encounter.

## 2015-07-21 NOTE — Telephone Encounter (Signed)
Called patient to check status of rescheduling [facility]. States she is going out of town and is about to start her cycle. Her last cycle was 07/05/15. She requests appointment on 08/17/15. Patient scheduled for 08/17/15 @ 8am for [facility]. Reviewed 72 hour cancellation policy with $034 fee. Patient understood and agreeable. Reviewed arrival time of 18am. Patient understood and agreeable.  Routing to [place] for final review before closing.

## 2015-08-10 ENCOUNTER — Encounter: Payer: Self-pay | Admitting: Obstetrics and Gynecology

## 2015-08-10 ENCOUNTER — Ambulatory Visit (INDEPENDENT_AMBULATORY_CARE_PROVIDER_SITE_OTHER): Payer: BC Managed Care – PPO

## 2015-08-10 ENCOUNTER — Ambulatory Visit (INDEPENDENT_AMBULATORY_CARE_PROVIDER_SITE_OTHER): Payer: BC Managed Care – PPO | Admitting: Obstetrics and Gynecology

## 2015-08-10 ENCOUNTER — Other Ambulatory Visit: Payer: Self-pay | Admitting: Obstetrics and Gynecology

## 2015-08-10 VITALS — BP 128/82 | HR 70 | Ht 65.25 in

## 2015-08-10 DIAGNOSIS — N930 Postcoital and contact bleeding: Secondary | ICD-10-CM

## 2015-08-10 DIAGNOSIS — N941 Dyspareunia: Secondary | ICD-10-CM | POA: Diagnosis not present

## 2015-08-10 DIAGNOSIS — D259 Leiomyoma of uterus, unspecified: Secondary | ICD-10-CM | POA: Diagnosis not present

## 2015-08-10 DIAGNOSIS — IMO0002 Reserved for concepts with insufficient information to code with codable children: Secondary | ICD-10-CM

## 2015-08-10 NOTE — Patient Instructions (Signed)
Endometrial Biopsy, Care After Refer to this sheet in the next few weeks. These instructions provide you with information on caring for yourself after your procedure. Your health care provider may also give you more specific instructions. Your treatment has been planned according to current medical practices, but problems sometimes occur. Call your health care provider if you have any problems or questions after your procedure. WHAT TO EXPECT AFTER THE PROCEDURE After your procedure, it is typical to have the following:  You may have mild cramping and a small amount of vaginal bleeding for a few days after the procedure. This is normal. HOME CARE INSTRUCTIONS  Only take over-the-counter or prescription medicine as directed by your health care provider.  Do not douche, use tampons, or have sexual intercourse until your health care provider approves.  Follow your health care provider's instructions regarding any activity restrictions, such as strenuous exercise or heavy lifting. SEEK MEDICAL CARE IF:  You have heavy bleeding or bleeding longer than 2 days after the procedure.  You have bad smelling drainage from your vagina.  You have a fever and chills.  Youhave severe lower stomach (abdominal) pain. SEEK IMMEDIATE MEDICAL CARE IF:  You have severe cramps in your stomach or back.  You pass large blood clots.  Your bleeding increases.  You become weak or lightheaded, or you pass out. Document Released: 09/08/2013 Document Reviewed: 09/08/2013 [employer] Patient Information 2015 [employer]. This information is not intended to replace advice given to you by your health care provider. Make sure you discuss any questions you have with your health care provider.  

## 2015-08-10 NOTE — Progress Notes (Signed)
Subjective  39 y.o. G11P0031 Hispanic female here for pelvic ultrasound for post coital bleeding for several months.  Does not occur all the time. No consistent time of the month.   Menses are regular. No excessive bleeding or significant pain.   Intercourse can cause pressure.  No dryness.  Denies use of instrumentation.   Status post BTL and condoms.   GC/CT negative.  Hx oral HSV I.  Status post colposcopy with biopsy and vulvar biopsy, each showing LGSIL.  Has follow up appointment for December 2016.   Objective  Pelvic ultrasound images and report reviewed with patient.  Uterus - 2 small fibroids - 0.85 cm and 2.5 cm intramural fibroids. EMS - 12.07 mm. Ovaries - normal.  Left ovary with 16 mm follicle.  Free fluid - no      Procedure - sonohysterogram Consent performed. Speculum placed in vagina. Sterile prep of cervix with  Betadine. Cannula placed inside endometrial cavity without difficulty. Speculum removed. Sterile saline injected.      No        filling defect noted. Cannula removed. No complication.   Procedure - endometrial biopsy Consent performed. Speculum place in vagina.  Sterile prep of cervix with  Betadine.  Pipelle placed to  7   cm without difficulty twice. Tissue obtained and sent to pathology. Speculum removed.  No complications. Minimal EBL.  Assessment  Postcoital bleeding.  Pressure with intercourse.  GC/CT negative.  Uterine fibroids.  I do not believe these are the source of the patient's postcoital bleeding or discomfort with intercourse. LGSIL of cervix and vulva.  Plan  Discussion of postcoital bleeding, dyspareunia, and uterine fibroids. Follow up EMB. If EMB negative, I would consider empiric course of Doxycycline 100 mg po bid for one week.  If postcoital bleeding and discomfort with intercourse persist, will consider hormonal contraceptive.  I also mentioned surgical exploration with laparoscopy if symptoms  persist and above treatments are not satisfactory.  Will return for repeat pap and recheck with me in December 2016.   __25_____ minutes face to face time of which over 50% was spent in counseling.   After visit summary to patient.

## 2015-08-14 LAB — IPS OTHER TISSUE BIOPSY

## 2015-08-15 ENCOUNTER — Telehealth: Payer: Self-pay | Admitting: Emergency Medicine

## 2015-08-15 MED ORDER — DOXYCYCLINE HYCLATE 100 MG PO CAPS: 100.0000 mg | ORAL_CAPSULE | Freq: Two times a day (BID) | ORAL | Status: DC

## 2015-08-15 NOTE — Telephone Encounter (Signed)
-----   Message from Nunzio Cobbs, MD sent at 08/14/2015 11:37 AM EDT ----- Please inform patient of her negative and normal endometrial biopsy.  I am recommending an empiric course of doxycycline 100 mg po bid for 7 days for her postcoital bleeding and dyspareunia.  Please send to her pharmacy of choice.  She and I have already discussed this plan.  Cc- Marisa Sprinkles

## 2015-08-15 NOTE — Telephone Encounter (Signed)
Call to patient and message from Dr Quincy Simmonds given.  Patient verbalized understanding of results and plan of care.  Order for Doxycycline per Dr. Quincy Simmonds to [facility], patient given instructions and advised to take with food to avoid stomach upset.  Patient advised to return call with concerns or continued symptoms.  Routing to provider for final review. Patient agreeable to disposition. Will close encounter.

## 2015-08-17 ENCOUNTER — Other Ambulatory Visit: Payer: BC Managed Care – PPO

## 2015-08-17 ENCOUNTER — Other Ambulatory Visit: Payer: BC Managed Care – PPO | Admitting: Obstetrics and Gynecology

## 2015-11-08 ENCOUNTER — Ambulatory Visit: Payer: BC Managed Care – PPO | Admitting: Obstetrics and Gynecology

## 2015-11-15 ENCOUNTER — Encounter: Payer: Self-pay | Admitting: Obstetrics and Gynecology

## 2015-11-15 ENCOUNTER — Ambulatory Visit (INDEPENDENT_AMBULATORY_CARE_PROVIDER_SITE_OTHER): Payer: BC Managed Care – PPO | Admitting: Obstetrics and Gynecology

## 2015-11-15 VITALS — BP 122/78 | HR 64 | Ht 65.25 in | Wt 229.6 lb

## 2015-11-15 DIAGNOSIS — IMO0002 Reserved for concepts with insufficient information to code with codable children: Secondary | ICD-10-CM

## 2015-11-15 DIAGNOSIS — N900 Mild vulvar dysplasia: Secondary | ICD-10-CM | POA: Diagnosis not present

## 2015-11-15 DIAGNOSIS — R896 Abnormal cytological findings in specimens from other organs, systems and tissues: Secondary | ICD-10-CM

## 2015-11-15 NOTE — Progress Notes (Signed)
Patient ID: Chelsea Short, female   DOB: Sep 17, 1976, 39 y.o.   MRN: [id number] GYNECOLOGY  VISIT   HPI: 39 y.o.   Legally Separated  Hispanic  female   [phone] with Patient's last menstrual period was 11/05/2015 (exact date).   here for repeat pap smear and recheck of her vulva.   Pap 04-24-15 ASCUS POSITIVE HR HPV. Status post colposcopy 05/22/15 with biopsy and right perineum biopsy, showing LGSIL and VIN I respectively.   Prior abnormal paps. Had colposcopy in 2006 and 2012 - HPV changes and ECC negative.  Pap 04/06/14 - ASCUS and positive HR HPV.  Colposcopy 05/04/14 - biopsies showed HPV changes/no dysplasia and ECC negative.  No prior treatment.   Status post BTL and uses condoms.   No further postcoital bleeding.  Never took the empiric doxycycline. Had a pelvic ultrasound showing 2 small fibroids (0.85 and 2.5 cm) and EMB was negative.  Negative GC/CT.  No lumps on her vulvar.   GYNECOLOGIC HISTORY: Patient's last menstrual period was 11/05/2015 (exact date). Contraception:Tubal and condoms Menopausal hormone therapy: n/a Last mammogram: n/a Last pap smear: 04-24-15 Ascus:Pos HR HPV;colposcopy ECC benign, cx bx LGSIL and Vulvar bx LGSIL        OB History    Gravida Para Term Preterm AB TAB SAB Ectopic Multiple Living   4 1   3 1 2   1          There are no active problems to display for this patient.   Past Medical History  Diagnosis Date  . Migraine without aura   . Abnormal Pap smear  2006, 2012    2006 abnormal pap; 04/25/11 ASCUS, colpo bx neg, 5/15 ASCUS +HPV  . HSV-1 infection   . Fibroids     Past Surgical History  Procedure Laterality Date  . Vaginal delivery    . Colposcopy       2006, 2012 neg & ecc neg  . Breast surgery Bilateral 2009    bilateral breast reduction  . Tubal ligation  2006    BTL    Current Outpatient Prescriptions  Medication Sig Dispense Refill  . aspirin-acetaminophen-caffeine (EXCEDRIN MIGRAINE) 250-250-65 MG per tablet  Take 2 tablets by mouth every 6 (six) hours as needed for pain.    [name] ibuprofen (ADVIL,MOTRIN) 200 MG tablet Take 1,200 mg by mouth 2 (two) times daily as needed for pain.    . Multiple Vitamin (MULTIVITAMIN) tablet Take 1 tablet by mouth daily.    . SUMAtriptan (IMITREX) 100 MG tablet Take 100 mg by mouth as needed. 1 tablet prn Migraine     No current facility-administered medications for this visit.     ALLERGIES: Clindamycin/lincomycin and Diflucan  Family History  Problem Relation Age of Onset  . Thyroid disease Mother   . Hypertension Father   . Thyroid disease Sister   . Hypertension Maternal Grandmother   . Diabetes Maternal Grandmother   . Cancer Maternal Grandfather     pancreatic  . Diabetes Paternal Grandmother     Social History   Social History  . Marital Status: Legally Separated    Spouse Name: N/A  . Number of Children: N/A  . Years of Education: N/A   Occupational History  . Not on file.   Social History Main Topics  . Smoking status: Never Smoker   . Smokeless tobacco: Never Used  . Alcohol Use: 0.0 oz/week    0 Standard drinks or equivalent per week     Comment: 1-2  glasses of wine/month  . Drug Use: No  . Sexual Activity:    Partners: Male    Birth Control/ Protection: Surgical     Comment: tubal ligation   Other Topics Concern  . Not on file   Social History Narrative    ROS:  Pertinent items are noted in HPI.  PHYSICAL EXAMINATION:    BP 122/78 mmHg  Pulse 64  Ht 5' 5.25" (1.657 m)  Wt 229 lb 9.6 oz (104.146 kg)  BMI 37.93 kg/m2  LMP 11/05/2015 (Exact Date)    General appearance: alert, cooperative and appears stated age   Pelvic: External genitalia:  no lesions              Urethra:  normal appearing urethra with no masses, tenderness or lesions              Bartholins and Skenes: normal                 Vagina: normal appearing vagina with normal color and discharge, no lesions              Cervix: no lesions              Pap  taken: Yes.   Bimanual Exam:  Uterus:  normal size, contour, position, consistency, mobility, non-tender              Adnexa: normal adnexa and no mass, fullness, tenderness              Rectovaginal: No..   Chaperone was present for exam.  ASSESSMENT  LGSIL. VIN I.  No visible lesions today. Postcoital bleeding resolved.    PLAN  Counseled regarding LGSIL and VIN I.   Follow up pap and HR HPV testing.  Return for annual exam in May 2017.    An After Visit Summary was printed and given to the patient.  _15_____ minutes face to face time of which over 50% was spent in counseling.

## 2015-11-17 LAB — IPS PAP TEST WITH HPV

## 2015-11-20 ENCOUNTER — Telehealth: Payer: Self-pay | Admitting: Obstetrics and Gynecology

## 2015-11-20 DIAGNOSIS — B009 Herpesviral infection, unspecified: Secondary | ICD-10-CM

## 2015-11-20 MED ORDER — METRONIDAZOLE 0.75 % VA GEL: 1.0000 | Freq: Every day | VAGINAL | Status: DC

## 2015-11-20 NOTE — Telephone Encounter (Signed)
Phone call to patient regarding pap results.  Pap showing no intraepithelial lesion and HR HPV is negative.  Pap also showing BV.   Cellular changes consistent with possible herpes infection.   Patient does state a hx of HSV I, on oral area. Has had 2 outbreaks.   Will treat BV with Metrogel pv at hs for 5 nights.  Will do serum testing for HSV I and II IgG.  Patient to return for lab visit.  Discussion of HSV I and II.

## 2015-11-24 ENCOUNTER — Other Ambulatory Visit (INDEPENDENT_AMBULATORY_CARE_PROVIDER_SITE_OTHER): Payer: BC Managed Care – PPO

## 2015-11-24 DIAGNOSIS — B009 Herpesviral infection, unspecified: Secondary | ICD-10-CM

## 2015-11-28 LAB — HSV(HERPES SIMPLEX VRS) I + II AB-IGG
HSV 1 Glycoprotein G Ab, IgG: 5.74 IV — ABNORMAL HIGH
HSV 2 Glycoprotein G Ab, IgG: 4.51 IV — ABNORMAL HIGH

## 2015-11-29 ENCOUNTER — Telehealth: Payer: Self-pay

## 2015-11-29 NOTE — Telephone Encounter (Signed)
-----   Message from Chelsea Dom, MD sent at 11/28/2015  3:39 PM EST ----- Please inform the patient that her herpes serology returned + for HSV 1 and 2. See if she wants to come in and discuss this with Dr Quincy Simmonds?

## 2015-11-29 NOTE — Telephone Encounter (Signed)
Spoke with patient. Advised of results as seen below. Patient verbalizes understanding. Patient declines OV with Dr.Silva. "I have never had an outbreak and I am not concerned at this time. I always use protection." Patient will return call with any concerns, questions, or if she would like to consult with Dr.Silva.  Routing to provider for final review. Patient agreeable to disposition. Will close encounter.

## 2016-01-08 ENCOUNTER — Telehealth: Payer: Self-pay | Admitting: Obstetrics and Gynecology

## 2016-01-08 MED ORDER — METRONIDAZOLE 500 MG PO TABS
500.0000 mg | ORAL_TABLET | Freq: Two times a day (BID) | ORAL | Status: DC
Start: 2016-01-08 — End: 2016-04-24

## 2016-01-08 NOTE — Telephone Encounter (Signed)
Left message to call [facility] at [phone].  Rx for Flagyl 500 mg po bid x 7 days #14 0RF sent to [facility] on file. Patient will need ETOH precautions.

## 2016-01-08 NOTE — Telephone Encounter (Signed)
Ok for Flagyl 500 mg po bid for 7 days to pharmacy of choice.   Cc- Chelsea Short

## 2016-01-08 NOTE — Telephone Encounter (Signed)
I called patient and she said that she never filled the Metrol gel because of the cost. She now needs it but can not afford it. She says that her insurance will cover the Flagyl in pill form. Please advise

## 2016-01-08 NOTE — Telephone Encounter (Signed)
Patient called and said, "I was seen in December and was prescribed Flagl in the gel form.  I'd like to get it in the pill form instead."  Pharmacy on file is correct.

## 2016-01-09 NOTE — Telephone Encounter (Signed)
Spoke with patient. Advised rx for Flagyl 500 mg bid x 7 days #14 0RF has been sent to her [facility] on file. ETOH precautions given. She verbalizes understanding.  Routing to provider for final review. Patient agreeable to disposition. Will close encounter.

## 2016-04-24 ENCOUNTER — Encounter: Payer: Self-pay | Admitting: Certified Nurse Midwife

## 2016-04-24 ENCOUNTER — Ambulatory Visit (INDEPENDENT_AMBULATORY_CARE_PROVIDER_SITE_OTHER): Payer: BC Managed Care – PPO | Admitting: Certified Nurse Midwife

## 2016-04-24 VITALS — BP 122/70 | HR 72 | Resp 16 | Ht 66.0 in | Wt 232.0 lb

## 2016-04-24 DIAGNOSIS — Z8742 Personal history of other diseases of the female genital tract: Secondary | ICD-10-CM | POA: Diagnosis not present

## 2016-04-24 DIAGNOSIS — Z124 Encounter for screening for malignant neoplasm of cervix: Secondary | ICD-10-CM

## 2016-04-24 DIAGNOSIS — Z0000 Encounter for general adult medical examination without abnormal findings: Secondary | ICD-10-CM | POA: Diagnosis not present

## 2016-04-24 DIAGNOSIS — R2232 Localized swelling, mass and lump, left upper limb: Secondary | ICD-10-CM | POA: Diagnosis not present

## 2016-04-24 DIAGNOSIS — Z01419 Encounter for gynecological examination (general) (routine) without abnormal findings: Secondary | ICD-10-CM

## 2016-04-24 DIAGNOSIS — N390 Urinary tract infection, site not specified: Secondary | ICD-10-CM

## 2016-04-24 LAB — POCT URINALYSIS DIPSTICK
Bilirubin, UA: NEGATIVE
Blood, UA: 250
GLUCOSE UA: NEGATIVE
Ketones, UA: NEGATIVE
NITRITE UA: POSITIVE
UROBILINOGEN UA: NEGATIVE
pH, UA: 6

## 2016-04-24 MED ORDER — NITROFURANTOIN MONOHYD MACRO 100 MG PO CAPS: 100.0000 mg | ORAL_CAPSULE | Freq: Two times a day (BID) | ORAL | Status: DC

## 2016-04-24 NOTE — Patient Instructions (Signed)
EXERCISE AND DIET:  We recommended that you start or continue a regular exercise program for good health. Regular exercise means any activity that makes your heart beat faster and makes you sweat.  We recommend exercising at least 30 minutes per day at least 3 days a week, preferably 4 or 5.  We also recommend a diet low in fat and sugar.  Inactivity, poor dietary choices and obesity can cause diabetes, heart attack, stroke, and kidney damage, among others.    ALCOHOL AND SMOKING:  Women should limit their alcohol intake to no more than 7 drinks/beers/glasses of wine (combined, not each!) per week. Moderation of alcohol intake to this level decreases your risk of breast cancer and liver damage. And of course, no recreational drugs are part of a healthy lifestyle.  And absolutely no smoking or even second hand smoke. Most people know smoking can cause heart and lung diseases, but did you know it also contributes to weakening of your bones? Aging of your skin?  Yellowing of your teeth and nails?  CALCIUM AND VITAMIN D:  Adequate intake of calcium and Vitamin D are recommended.  The recommendations for exact amounts of these supplements seem to change often, but generally speaking 600 mg of calcium (either carbonate or citrate) and 800 units of Vitamin D per day seems prudent. Certain women may benefit from higher intake of Vitamin D.  If you are among these women, your doctor will have told you during your visit.    PAP SMEARS:  Pap smears, to check for cervical cancer or precancers,  have traditionally been done yearly, although recent scientific advances have shown that most women can have pap smears less often.  However, every woman still should have a physical exam from her gynecologist every year. It will include a breast check, inspection of the vulva and vagina to check for abnormal growths or skin changes, a visual exam of the cervix, and then an exam to evaluate the size and shape of the uterus and  ovaries.  And after 40 years of age, a rectal exam is indicated to check for rectal cancers. We will also provide age appropriate advice regarding health maintenance, like when you should have certain vaccines, screening for sexually transmitted diseases, bone density testing, colonoscopy, mammograms, etc.   MAMMOGRAMS:  All women over 40 years old should have a yearly mammogram. Many facilities now offer a "3D" mammogram, which may cost around $50 extra out of pocket. If possible,  we recommend you accept the option to have the 3D mammogram performed.  It both reduces the number of women who will be called back for extra views which then turn out to be normal, and it is better than the routine mammogram at detecting truly abnormal areas.    COLONOSCOPY:  Colonoscopy to screen for colon cancer is recommended for all women at age 50.  We know, you hate the idea of the prep.  We agree, BUT, having colon cancer and not knowing it is worse!!  Colon cancer so often starts as a polyp that can be seen and removed at colonscopy, which can quite literally save your life!  And if your first colonoscopy is normal and you have no family history of colon cancer, most women don't have to have it again for 10 years.  Once every ten years, you can do something that may end up saving your life, right?  We will be happy to help you get it scheduled when you are ready.    Be sure to check your insurance coverage so you understand how much it will cost.  It may be covered as a preventative service at no cost, but you should check your particular policy.     Asymptomatic Bacteriuria, Female Asymptomatic bacteriuria is the presence of a large number of bacteria in your urine without the usual symptoms of burning or frequent urination. The following conditions increase the risk of asymptomatic bacteriuria:  Diabetes mellitus.  Advanced age.  Pregnancy in the first trimester.  Kidney stones.  Kidney transplants.  Leaky  kidney tube valve in young children (reflux). Treatment for this condition is not needed in most people and can lead to other problems such as too much yeast and growth of resistant bacteria. However, some people, such as pregnant women, do need treatment to prevent kidney infection. Asymptomatic bacteriuria in pregnancy is also associated with fetal growth restriction, premature labor, and newborn death. HOME CARE INSTRUCTIONS Monitor your condition for any changes. The following actions may help to relieve any discomfort you are feeling:  Drink enough water and fluids to keep your urine clear or pale yellow. Go to the bathroom more often to keep your bladder empty.  Keep the area around your vagina and rectum clean. Wipe yourself from front to back after urinating. SEEK IMMEDIATE MEDICAL CARE IF:  You develop signs of an infection such as:  Burning with urination.  Frequency of voiding.  Back pain.  Fever.  You have blood in the urine.  You develop a fever. MAKE SURE YOU:  Understand these instructions.  Will watch your condition.  Will get help right away if you are not doing well or get worse.   This information is not intended to replace advice given to you by your health care provider. Make sure you discuss any questions you have with your health care provider.   Document Released: 11/18/2005 Document Revised: 12/09/2014 Document Reviewed: 05/10/2013 Elsevier Interactive Patient Education [employer].

## 2016-04-24 NOTE — Progress Notes (Signed)
40 y.o. [id number] Legally Separated  Hispanic Fe here for annual exam. Periods changing with onset of bright red and then pink with 5 day duration.Periods are monthly. Patient has gained 12 pounds. Patient has a spot in vaginal area that itching for 1-2 days. Not present now. Will call in when occurs and come for evaluation. Patient not having any urinary frequency,urgency,or pain with urination. Has history of UTI. Nitrites noted in urine.  Sees PCP for labs and medication management for migraines. Has also noted ? Growth on inside of left wrist, slightly sore. No injury to area that she is aware of. No other health issues today. Planning vacation this summer.  No LMP recorded.          Sexually active: Yes.    The current method of family planning is tubal ligation and condoms.    Exercising: No.  The patient does not participate in regular exercise at present. Smoker:  no  Health Maintenance: Pap:  11/30/15 Pap and HR HPV negative Colonoscopy:  TDaP:  04/06/14 Labs: Urine micro,culture Urine: 250 RBCs; trace of protein, nitrite positive, leuks trace   reports that she has never smoked. She has never used smokeless tobacco. She reports that she drinks alcohol. She reports that she does not use illicit drugs.  Past Medical History  Diagnosis Date  . Migraine without aura   . Abnormal Pap smear  2006, 2012    2006 abnormal pap; 04/25/11 ASCUS, colpo bx neg, 5/15 ASCUS +HPV  . HSV-1 infection   . Fibroids     Past Surgical History  Procedure Laterality Date  . Vaginal delivery    . Colposcopy       2006, 2012 neg & ecc neg  . Breast surgery Bilateral 2009    bilateral breast reduction  . Tubal ligation  2006    BTL    Current Outpatient Prescriptions  Medication Sig Dispense Refill  . aspirin-acetaminophen-caffeine (EXCEDRIN MIGRAINE) 250-250-65 MG per tablet Take 2 tablets by mouth every 6 (six) hours as needed for pain.    [name] ibuprofen (ADVIL,MOTRIN) 200 MG tablet Take 1,200 mg by  mouth 2 (two) times daily as needed for pain.    . metroNIDAZOLE (FLAGYL) 500 MG tablet Take 1 tablet (500 mg total) by mouth 2 (two) times daily. 14 tablet 0  . metroNIDAZOLE (METROGEL) 0.75 % vaginal gel Place 1 Applicatorful vaginally at bedtime. Use for 5 nights. 70 g 0  . Multiple Vitamin (MULTIVITAMIN) tablet Take 1 tablet by mouth daily.    . SUMAtriptan (IMITREX) 100 MG tablet Take 100 mg by mouth as needed. 1 tablet prn Migraine     No current facility-administered medications for this visit.    Family History  Problem Relation Age of Onset  . Thyroid disease Mother   . Hypertension Father   . Thyroid disease Sister   . Hypertension Maternal Grandmother   . Diabetes Maternal Grandmother   . Cancer Maternal Grandfather     pancreatic  . Diabetes Paternal Grandmother     ROS:  Pertinent items are noted in HPI.  Otherwise, a comprehensive ROS was negative.  Exam:   There were no vitals taken for this visit.   Ht Readings from Last 3 Encounters:  11/15/15 5' 5.25" (1.657 m)  08/10/15 5' 5.25" (1.657 m)  04/24/15 5\' 5"  (1.651 m)    General appearance: alert, cooperative and appears stated age Head: Normocephalic, without obvious abnormality, atraumatic Neck: no adenopathy, supple, symmetrical, trachea midline and thyroid  normal to inspection and palpation Lungs: clear to auscultation bilaterally Breasts: normal appearance, no masses or tenderness, No nipple retraction or dimpling, No nipple discharge or bleeding, No axillary or supraclavicular adenopathy, reduction scarring noted Heart: regular rate and rhythm Abdomen: soft, non-tender; no masses,  no organomegaly Extremities: extremities normal, atraumatic, no cyanosis or edema, Left wrist small soft mobile cystic feel mass ? Ganglion cyst, non tender Skin: Skin color, texture, turgor normal. No rashes or lesions Lymph nodes: Cervical, supraclavicular, and axillary nodes normal. No abnormal inguinal nodes  palpated Neurologic: Grossly normal   Pelvic: External genitalia:  no lesions              Urethra:  normal appearing urethra with no masses, tenderness or lesions              Bartholin's and Skene's: normal                 Vagina: normal appearing vagina with normal color and discharge, no lesions              Cervix: normal, non tender, no lesions, bleeding present              Pap taken: Yes.   Bimanual Exam:  Uterus:  normal size, contour, position, consistency, mobility, non-tender              Adnexa: normal adnexa and no mass, fullness, tenderness               Rectovaginal: Confirms               Anus:  normal sphincter tone, no lesions  Chaperone present: yes  A:  Well Woman with normal exam  Contraception BTL  Menstrual cycle change in amount only ? Related to 12 pound weight change  ? HSV outbreak occurring, History of HSV1,2  Ganglion cyst? Left wrist  Asymptomatic bacturia  History of abnormal pap smear ASCUS with + HPHVR with LSIL on colpo follow up pap today    P:   Reviewed health and wellness pertinent to exam  Discussed weight change effect on cycles and normal and abnormal paramenters. Given menses calendar to monitor and will advise if abnormal.  Discussed she may be having HSV outbreak with monthly itching for 2 days in vaginal area. Patient will call when occurring and come in for evaluation  Discussed possible ganglion cyst, but needs evaluation she will see PCP.  Discussed UTI and need for treatment per findings on urine check.  Questions addressed. Needs to increase water intake. Warning signs and symptoms given.  Rx Macrobid see order  Lab: Urine micro, culture  Pap smear as above with HPVHR, if normal repeat one year, if not per results.   counseled on breast self exam, first mammography screening and given information to schedule, adequate intake of calcium and vitamin D, diet and exercise  return annually or prn  An After Visit Summary was printed  and given to the patient.

## 2016-04-25 LAB — URINALYSIS, MICROSCOPIC ONLY
CASTS: NONE SEEN [LPF]
CRYSTALS: NONE SEEN [HPF]
RBC / HPF: NONE SEEN RBC/HPF (ref <=2)
YEAST: NONE SEEN [HPF]

## 2016-04-25 NOTE — Progress Notes (Signed)
Reviewed personally.  M. Suzanne Miosha Behe, MD.  

## 2016-04-26 LAB — URINE CULTURE: Colony Count: >=100000

## 2016-04-30 LAB — IPS PAP TEST WITH HPV

## 2016-05-09 ENCOUNTER — Telehealth: Payer: Self-pay

## 2016-05-09 ENCOUNTER — Other Ambulatory Visit: Payer: Self-pay | Admitting: Certified Nurse Midwife

## 2016-05-09 DIAGNOSIS — R87610 Atypical squamous cells of undetermined significance on cytologic smear of cervix (ASC-US): Secondary | ICD-10-CM

## 2016-05-09 MED ORDER — METRONIDAZOLE 500 MG PO TABS: 500.0000 mg | ORAL_TABLET | Freq: Two times a day (BID) | ORAL | Status: DC

## 2016-05-09 NOTE — Telephone Encounter (Signed)
-----   Message from Regina Eck, CNM sent at 05/09/2016  8:22 AM EDT ----- Notify patient that pap smear showed ASCUS with negative HPVHR due to previous abnormal she will need colpo again please schedule order placed Also BV noted on pap will need treatment Rx Metrogel order placed ( before colpo)

## 2016-05-09 NOTE — Telephone Encounter (Signed)
Spoke with patient. Advised of message as seen below from [place]. She is agreeable and verbalizes understanding. Patient states that Metrogel costs her $85. Requesting rx for Flagyl to reduce cost. Rx for Flagyl 500 mg bid x7 days #14 0RF sent to pharmacy on file. ETOH precautions given. She is agreeable and verbalizes understanding. Patient has a tubal ligation. Colposcopy scheduled for 06/06/2016 at 2 pm with Melvia Heaps CNM. She is agreeable to date and time.  Instructions given. Motrin 800 mg po x , one hour before appointment with food. Make sure to eat a meal before appointment and drink plenty of fluids. Patient verbalized understanding and will call to reschedule if will be on menses or has any concerns regarding pregnancy. Order placed for precert.  Routing to provider for final review. Patient agreeable to disposition. Will close encounter.

## 2016-06-06 ENCOUNTER — Encounter: Payer: Self-pay | Admitting: Certified Nurse Midwife

## 2016-06-06 ENCOUNTER — Ambulatory Visit (INDEPENDENT_AMBULATORY_CARE_PROVIDER_SITE_OTHER): Payer: BC Managed Care – PPO | Admitting: Certified Nurse Midwife

## 2016-06-06 DIAGNOSIS — R87610 Atypical squamous cells of undetermined significance on cytologic smear of cervix (ASC-US): Secondary | ICD-10-CM | POA: Diagnosis not present

## 2016-06-06 NOTE — Patient Instructions (Signed)

## 2016-06-06 NOTE — Progress Notes (Signed)
Encounter reviewed Lucinda Spells, MD   

## 2016-06-06 NOTE — Progress Notes (Addendum)
Patient ID: Chelsea Short, female   DOB: 1976/07/24, 40 y.o.   MRN: [id number]  Chief Complaint  Patient presents with  . Colposcopy    //jj    HPI Chelsea Short is a 40 y.o. [phone] legally separated female here for colposcopy exam. BV noted on pap smear and treated with Metrogel 2 weeks ago. Denies vaginal discharge, bleeding or pelvic pain.[name]   HPI Indications: Pap smear on Apr 24 2016 showed: ASCUS with NEGATIVE high risk HPV. 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 Prior cervical treatment: expectant management.  Past Medical History  Diagnosis Date  . Migraine without aura   . Abnormal Pap smear  2006, 2012    2006 abnormal pap; 04/25/11 ASCUS, colpo bx neg, 5/15 & 5/17 ASCUS +HPV  . HSV-1 infection   . Fibroids     Past Surgical History  Procedure Laterality Date  . Vaginal delivery    . Colposcopy       2006, 2012 neg & ecc neg  . Breast surgery Bilateral 2009    bilateral breast reduction  . Tubal ligation  2006    BTL    Family History  Problem Relation Age of Onset  . Thyroid disease Mother   . Hypertension Father   . Thyroid disease Sister   . Hypertension Maternal Grandmother   . Diabetes Maternal Grandmother   . Cancer Maternal Grandfather     pancreatic  . Diabetes Paternal Grandmother     Social History Social History  Substance Use Topics  . Smoking status: Never Smoker   . Smokeless tobacco: Never Used  . Alcohol Use: 0.0 oz/week    0 Standard drinks or equivalent per week     Comment: 1-2 glasses of wine/month    Allergies  Allergen Reactions  . Clindamycin/Lincomycin Rash  . Diflucan [Fluconazole]     Oral ulcers     Current Outpatient Prescriptions  Medication Sig Dispense Refill  . aspirin-acetaminophen-caffeine (EXCEDRIN MIGRAINE) 250-250-65 MG per tablet Take 2 tablets by mouth every 6 (six) hours as needed for pain.    [name] ibuprofen (ADVIL,MOTRIN) 200 MG tablet Take 1,200 mg by mouth 2 (two) times daily as needed for pain.    .  Multiple Vitamin (MULTIVITAMIN) tablet Take 1 tablet by mouth daily.    . SUMAtriptan (IMITREX) 100 MG tablet Take 100 mg by mouth as needed. 1 tablet prn Migraine     No current facility-administered medications for this visit.    Review of Systems Review of Systems  Constitutional: Negative.   Genitourinary: Negative for vaginal bleeding, vaginal discharge and vaginal pain.  Skin: Negative.     Blood pressure 110/64, pulse 72, resp. rate 16, height 5\' 6"  (1.676 m), weight 231 lb (104.781 kg), last menstrual period 05/16/2016.  Physical Exam Physical Exam  Constitutional: She is oriented to person, place, and time. She appears well-developed and well-nourished.  Genitourinary: Vagina normal.    Neurological: She is alert and oriented to person, place, and time.  Skin: Skin is warm and dry.  Psychiatric: She has a normal mood and affect. Her behavior is normal. Judgment and thought content normal.    Data Reviewed Reviewed pap smear results and questions answered.  Assessment    Procedure Details  The risks and benefits of the procedure and Written informed consent obtained. Questions addressed.  Speculum placed in vagina and excellent visualization of cervix achieved, cervix swabbed x 3 with saline and  acetic acid solution. Acetowhite lesion noted at 7 and 11 o'clock  with mosaic pattern noted at 11 o'clock. Lugol's solution applied with non staining noted in same area. Cervix viewed with 3.75, 7.5 and 15# and green filter. Biopsy taken at 7 and 11 o' clock. ECC obtained. Monsel's applied. No active bleeding noted on speculum removal. Patient tolerated procedure well. Instructions given.  Specimens: 3  Complications: none.     Plan    Specimens labelled and sent to Pathology. Patient will be contacted with results when reviewed.   Pathology reviewed. 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 Biopsy at 11 o'clock  showed benign squamous with mixed acute/chronic reparative changes, negative for dysplasia or HPV effect. Ecc showed squamous mucosa with LSIL, mild dysplasia with HPV effect. Benign glandular tissue. Patient will be called with results and importance of repeat pap smear in one year. Pap recall placed 08   [facility] 06/06/2016, 2:26 PM

## 2016-06-06 NOTE — Progress Notes (Signed)
Previous abnormal hx with colposcopy ASCUS HPV HR+ on pap 04-24-16 Pt took 1000mg  at about 12:45

## 2016-06-10 LAB — IPS OTHER TISSUE BIOPSY

## 2016-06-13 ENCOUNTER — Telehealth: Payer: Self-pay

## 2016-06-13 NOTE — Telephone Encounter (Signed)
-----   Message from Regina Eck, CNM sent at 06/13/2016  3:01 AM EDT ----- Notify patient that Cervical biopsy at 7 o'clock showed LSIL with HPV effect and ECC showed LSIL with HPV effect  Both CIN 1 this was what she had with first colposcopy, so no change on cervix, previous ECC negative Cervical biopsy at 11 o'clock was benign, negative for HPV effect Needs repeat pap smear in one year, very important to keep appointment Pap recall 08

## 2016-06-13 NOTE — Telephone Encounter (Signed)
Spoke with patient. Advised of message and results as seen below from [place]. She is agreeable and verbalizes understanding. Next aex scheduled for 04/30/2017. 08 recall placed.  Routing to provider for final review. Patient agreeable to disposition. Will close encounter.

## 2017-01-21 DIAGNOSIS — F411 Generalized anxiety disorder: Secondary | ICD-10-CM | POA: Insufficient documentation

## 2017-04-30 ENCOUNTER — Ambulatory Visit: Payer: BC Managed Care – PPO | Admitting: Certified Nurse Midwife

## 2017-06-03 ENCOUNTER — Ambulatory Visit (INDEPENDENT_AMBULATORY_CARE_PROVIDER_SITE_OTHER): Payer: BC Managed Care – PPO | Admitting: Certified Nurse Midwife

## 2017-06-03 ENCOUNTER — Encounter: Payer: Self-pay | Admitting: Certified Nurse Midwife

## 2017-06-03 ENCOUNTER — Other Ambulatory Visit (HOSPITAL_COMMUNITY)
Admission: RE | Admit: 2017-06-03 | Discharge: 2017-06-03 | Disposition: A | Discharge status: Home / Self Care | Payer: BC Managed Care – PPO | Source: Ambulatory Visit | Attending: Certified Nurse Midwife | Admitting: Certified Nurse Midwife

## 2017-06-03 VITALS — BP 118/72 | HR 68 | Resp 16 | Ht 65.0 in | Wt 233.0 lb

## 2017-06-03 DIAGNOSIS — Z124 Encounter for screening for malignant neoplasm of cervix: Secondary | ICD-10-CM

## 2017-06-03 DIAGNOSIS — B372 Candidiasis of skin and nail: Secondary | ICD-10-CM

## 2017-06-03 DIAGNOSIS — Z01419 Encounter for gynecological examination (general) (routine) without abnormal findings: Secondary | ICD-10-CM | POA: Diagnosis not present

## 2017-06-03 DIAGNOSIS — B9689 Other specified bacterial agents as the cause of diseases classified elsewhere: Secondary | ICD-10-CM | POA: Diagnosis not present

## 2017-06-03 DIAGNOSIS — L298 Other pruritus: Secondary | ICD-10-CM | POA: Diagnosis not present

## 2017-06-03 DIAGNOSIS — Z0000 Encounter for general adult medical examination without abnormal findings: Secondary | ICD-10-CM

## 2017-06-03 DIAGNOSIS — N898 Other specified noninflammatory disorders of vagina: Secondary | ICD-10-CM

## 2017-06-03 DIAGNOSIS — Z1231 Encounter for screening mammogram for malignant neoplasm of breast: Secondary | ICD-10-CM | POA: Diagnosis not present

## 2017-06-03 DIAGNOSIS — B3749 Other urogenital candidiasis: Secondary | ICD-10-CM | POA: Insufficient documentation

## 2017-06-03 MED ORDER — NYSTATIN-TRIAMCINOLONE 100000-0.1 UNIT/GM-% EX OINT: 1.0000 "application " | TOPICAL_OINTMENT | Freq: Two times a day (BID) | CUTANEOUS | 1 refills | Status: AC

## 2017-06-03 NOTE — Patient Instructions (Addendum)
EXERCISE AND DIET:  We recommended that you start or continue a regular exercise program for good health. Regular exercise means any activity that makes your heart beat faster and makes you sweat.  We recommend exercising at least 30 minutes per day at least 3 days a week, preferably 4 or 5.  We also recommend a diet low in fat and sugar.  Inactivity, poor dietary choices and obesity can cause diabetes, heart attack, stroke, and kidney damage, among others.    ALCOHOL AND SMOKING:  Women should limit their alcohol intake to no more than 7 drinks/beers/glasses of wine (combined, not each!) per week. Moderation of alcohol intake to this level decreases your risk of breast cancer and liver damage. And of course, no recreational drugs are part of a healthy lifestyle.  And absolutely no smoking or even second hand smoke. Most people know smoking can cause heart and lung diseases, but did you know it also contributes to weakening of your bones? Aging of your skin?  Yellowing of your teeth and nails?  CALCIUM AND VITAMIN D:  Adequate intake of calcium and Vitamin D are recommended.  The recommendations for exact amounts of these supplements seem to change often, but generally speaking 600 mg of calcium (either carbonate or citrate) and 800 units of Vitamin D per day seems prudent. Certain women may benefit from higher intake of Vitamin D.  If you are among these women, your doctor will have told you during your visit.    PAP SMEARS:  Pap smears, to check for cervical cancer or precancers,  have traditionally been done yearly, although recent scientific advances have shown that most women can have pap smears less often.  However, every woman still should have a physical exam from her gynecologist every year. It will include a breast check, inspection of the vulva and vagina to check for abnormal growths or skin changes, a visual exam of the cervix, and then an exam to evaluate the size and shape of the uterus and  ovaries.  And after 40 years of age, a rectal exam is indicated to check for rectal cancers. We will also provide age appropriate advice regarding health maintenance, like when you should have certain vaccines, screening for sexually transmitted diseases, bone density testing, colonoscopy, mammograms, etc.   MAMMOGRAMS:  All women over 40 years old should have a yearly mammogram. Many facilities now offer a "3D" mammogram, which may cost around $50 extra out of pocket. If possible,  we recommend you accept the option to have the 3D mammogram performed.  It both reduces the number of women who will be called back for extra views which then turn out to be normal, and it is better than the routine mammogram at detecting truly abnormal areas.    COLONOSCOPY:  Colonoscopy to screen for colon cancer is recommended for all women at age 50.  We know, you hate the idea of the prep.  We agree, BUT, having colon cancer and not knowing it is worse!!  Colon cancer so often starts as a polyp that can be seen and removed at colonscopy, which can quite literally save your life!  And if your first colonoscopy is normal and you have no family history of colon cancer, most women don't have to have it again for 10 years.  Once every ten years, you can do something that may end up saving your life, right?  We will be happy to help you get it scheduled when you are ready.    Be sure to check your insurance coverage so you understand how much it will cost.  It may be covered as a preventative service at no cost, but you should check your particular policy.      Vaginal Yeast infection, Adult Vaginal yeast infection is a condition that causes soreness, swelling, and redness (inflammation) of the vagina. It also causes vaginal discharge. This is a common condition. Some women get this infection frequently. What are the causes? This condition is caused by a change in the normal balance of the yeast (candida) and bacteria that live  in the vagina. This change causes an overgrowth of yeast, which causes the inflammation. What increases the risk? This condition is more likely to develop in:  Women who take antibiotic medicines.  Women who have diabetes.  Women who take birth control pills.  Women who are pregnant.  Women who douche often.  Women who have a weak defense (immune) system.  Women who have been taking steroid medicines for a long time.  Women who frequently wear tight clothing.  What are the signs or symptoms? Symptoms of this condition include:  White, thick vaginal discharge.  Swelling, itching, redness, and irritation of the vagina. The lips of the vagina (vulva) may be affected as well.  Pain or a burning feeling while urinating.  Pain during sex.  How is this diagnosed? This condition is diagnosed with a medical history and physical exam. This will include a pelvic exam. Your health care provider will examine a sample of your vaginal discharge under a microscope. Your health care provider may send this sample for testing to confirm the diagnosis. How is this treated? This condition is treated with medicine. Medicines may be over-the-counter or prescription. You may be told to use one or more of the following:  Medicine that is taken orally.  Medicine that is applied as a cream.  Medicine that is inserted directly into the vagina (suppository).  Follow these instructions at home:  Take or apply over-the-counter and prescription medicines only as told by your health care provider.  Do not have sex until your health care provider has approved. Tell your sex partner that you have a yeast infection. That person should go to his or her health care provider if he or she develops symptoms.  Do not wear tight clothes, such as pantyhose or tight pants.  Avoid using tampons until your health care provider approves.  Eat more yogurt. This may help to keep your yeast infection from  returning.  Try taking a sitz bath to help with discomfort. This is a warm water bath that is taken while you are sitting down. The water should only come up to your hips and should cover your buttocks. Do this 3-4 times per day or as told by your health care provider.  Do not douche.  Wear breathable, cotton underwear.  If you have diabetes, keep your blood sugar levels under control. Contact a health care provider if:  You have a fever.  Your symptoms go away and then return.  Your symptoms do not get better with treatment.  Your symptoms get worse.  You have new symptoms.  You develop blisters in or around your vagina.  You have blood coming from your vagina and it is not your menstrual period.  You develop pain in your abdomen. This information is not intended to replace advice given to you by your health care provider. Make sure you discuss any questions you have with your health   care provider. Document Released: 08/28/2005 Document Revised: 05/01/2016 Document Reviewed: 05/22/2015 Elsevier Interactive Patient Education  2018 [employer]  

## 2017-06-03 NOTE — Progress Notes (Signed)
41 y.o. [phone] Legally Divorced Hispanic Fe here for annual exam. Periods normal, no issues. Sees PCP for aex/labs and migraine management. Now with [employer] now with job change. Partner same, desires STD screening. Complaining of increase itching externally in vaginal and groin area, No new personal products. Onset 2 weeks ago. Has tried OTC medication with no change. Please check. No other health issues today..  Patient's last menstrual period was 05/17/2017 (exact date).          Sexually active: Yes.    The current method of family planning is tubal ligation.    Exercising: No.  exercise Smoker:  no  Health Maintenance: Pap:  11-30-15 neg HPV HR neg, 04-24-16 ASCUS HPV HR- History of Abnormal Pap: yes colpo in the past MMG:  none Self Breast exams: occ Colonoscopy:  none BMD:   none TDaP:  2015 Shingles: no Pneumonia: no Hep C and HIV: HIV neg 2016 Labs: none   reports that she has never smoked. She has never used smokeless tobacco. She reports that she drinks alcohol. She reports that she does not use drugs.  Past Medical History:  Diagnosis Date  . Abnormal Pap smear 2006, 2012   2006 abnormal pap; 04/25/11 ASCUS, colpo bx neg, 5/15 & 5/17 ASCUS +HPV, 2017 ASCUS HPV-  . Fibroids   . HSV-1 infection   . Migraine without aura     Past Surgical History:  Procedure Laterality Date  . BREAST SURGERY Bilateral 2009   bilateral breast reduction  . COLPOSCOPY      2006, 2012 neg & ecc neg  . TUBAL LIGATION  2006   BTL  . VAGINAL DELIVERY      Current Outpatient Prescriptions  Medication Sig Dispense Refill  . aspirin-acetaminophen-caffeine (EXCEDRIN MIGRAINE) 250-250-65 MG per tablet Take 2 tablets by mouth every 6 (six) hours as needed for pain.    [name] ibuprofen (ADVIL,MOTRIN) 200 MG tablet Take 1,200 mg by mouth 2 (two) times daily as needed for pain.    . Multiple Vitamin (MULTIVITAMIN) tablet Take 1 tablet by mouth daily.     No current  facility-administered medications for this visit.     Family History  Problem Relation Age of Onset  . Thyroid disease Mother   . Hypertension Father   . Thyroid disease Sister   . Hypertension Maternal Grandmother   . Diabetes Maternal Grandmother   . Cancer Maternal Grandfather        pancreatic  . Diabetes Paternal Grandmother     ROS:  Pertinent items are noted in HPI.  Otherwise, a comprehensive ROS was negative.  Exam:   BP 118/72   Pulse 68   Resp 16   Ht 5\' 5"  (1.651 m)   Wt 233 lb (105.7 kg)   LMP 05/17/2017 (Exact Date)   BMI 38.77 kg/m  Height: 5\' 5"  (165.1 cm) Ht Readings from Last 3 Encounters:  06/03/17 5\' 5"  (1.651 m)  06/06/16 5\' 6"  (1.676 m)  04/24/16 5\' 6"  (1.676 m)    General appearance: alert, cooperative and appears stated age Head: Normocephalic, without obvious abnormality, atraumatic Neck: no adenopathy, supple, symmetrical, trachea midline and thyroid normal to inspection and palpation Lungs: clear to auscultation bilaterally Breasts: normal appearance, no masses or tenderness, No nipple retraction or dimpling, No nipple discharge or bleeding, No axillary or supraclavicular adenopathy Heart: regular rate and rhythm Abdomen: soft, non-tender; no masses,  no organomegaly Extremities: extremities normal, atraumatic, no cyanosis or edema Skin: Skin color, texture,  turgor normal. No rashes or lesions Lymph nodes: Cervical, supraclavicular, and axillary nodes normal. No abnormal inguinal nodes palpated Neurologic: Grossly normal   Pelvic: External genitalia:  no lesions, normal appearance with scaling noted on external vulva and groin area bilateral. Wet prep taken              Urethra:  normal appearing urethra with no masses, tenderness or lesions              Bartholin's and Skene's: normal                 Vagina: normal appearing vagina with normal color and discharge, no lesions              Cervix: no cervical motion tenderness, no lesions  and normal appearance              Pap taken: No. Bimanual Exam:  Uterus:  normal size, contour, position, consistency, mobility, non-tender              Adnexa: normal adnexa and no mass, fullness, tenderness               Rectovaginal: Confirms               Anus:  normal sphincter tone, no lesions  Chaperone present: yes  A:  Well Woman with normal exam  Contraception BTL  Yeast dermatitis  Follow up pap of ASCUS -HPVHR  Screening labs  Mammogram due    P:   Reviewed health and wellness pertinent to exam  Discussed finding of yeast dermatitis and etiology. Discussed need for treatment. Also discussed comfort measures with aveeno sitz bath for itching. Questions addressed.  Rx Nystatin/Trimcinolone ointment see order with instructions  Labs:HIV,RPR, Hep C,GC/Chlamydia, Trich,Yeast, BV  Will schedule mammogram prior to leaving today  Pap smear: yes, if negative repeat one year, if not per results   counseled on breast self exam, mammography screening, STD prevention, HIV risk factors and prevention, adequate intake of calcium and vitamin D  return annually or prn  An After Visit Summary was printed and given to the patient.

## 2017-06-03 NOTE — Progress Notes (Signed)
Patient scheduled while in office at [facility] for //screening MMG. Spoke with Alaina, scheduled for 06/24/17 arriving at 4:20pm for 4:40pm appointment. Patient verbalizes understanding and is agreeable.

## 2017-06-04 LAB — HEPATITIS C ANTIBODY: Hep C Virus Ab: <0.1 s/co ratio (ref 0.0–0.9)

## 2017-06-04 LAB — HEP, RPR, HIV PANEL
HIV SCREEN 4TH GENERATION: NONREACTIVE
Hepatitis B Surface Ag: NEGATIVE
RPR: NONREACTIVE

## 2017-06-06 LAB — CYTOLOGY - PAP
Bacterial vaginitis: POSITIVE — AB
CHLAMYDIA, DNA PROBE: NEGATIVE
Candida vaginitis: POSITIVE — AB
DIAGNOSIS: NEGATIVE
HPV (WINDOPATH): NOT DETECTED
NEISSERIA GONORRHEA: NEGATIVE
TRICH (WINDOWPATH): NEGATIVE

## 2017-06-10 ENCOUNTER — Telehealth: Payer: Self-pay

## 2017-06-10 ENCOUNTER — Other Ambulatory Visit: Payer: Self-pay

## 2017-06-10 NOTE — Telephone Encounter (Signed)
Left message to call back  

## 2017-06-10 NOTE — Telephone Encounter (Signed)
lmtcb

## 2017-06-10 NOTE — Telephone Encounter (Signed)
Patient returning call to Joy.  

## 2017-06-12 MED ORDER — METRONIDAZOLE 500 MG PO TABS: 500.0000 mg | ORAL_TABLET | Freq: Two times a day (BID) | ORAL | 0 refills | Status: AC

## 2017-06-12 NOTE — Telephone Encounter (Signed)
Patient returned call

## 2017-06-12 NOTE — Telephone Encounter (Signed)
Patient notified of results as written by provider 

## 2017-06-12 NOTE — Telephone Encounter (Signed)
Pt notified of results

## 2017-06-12 NOTE — Telephone Encounter (Signed)
Left message for call back.

## 2017-06-24 ENCOUNTER — Ambulatory Visit
Admission: RE | Admit: 2017-06-24 | Discharge: 2017-06-24 | Disposition: A | Discharge status: Home / Self Care | Payer: BC Managed Care – PPO | Source: Ambulatory Visit | Attending: Certified Nurse Midwife | Admitting: Certified Nurse Midwife

## 2017-06-24 DIAGNOSIS — Z1231 Encounter for screening mammogram for malignant neoplasm of breast: Secondary | ICD-10-CM

## 2020-02-23 ENCOUNTER — Encounter: Payer: Self-pay | Admitting: Certified Nurse Midwife
